# Patient Record
Sex: Male | Born: 1980 | Race: White | Hispanic: No | Marital: Single | Smoking: Current every day smoker
Health system: Southern US, Community
[De-identification: ages and names within clinical notes are randomized; demographics above are authoritative.]

---
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
## 2008-06-26 ENCOUNTER — Emergency Department (HOSPITAL_COMMUNITY): Admission: EM | Admit: 2008-06-26 | Discharge: 2008-06-26 | Payer: Self-pay | Admitting: Emergency Medicine

## 2013-01-30 ENCOUNTER — Emergency Department (HOSPITAL_COMMUNITY): Payer: Self-pay

## 2013-01-30 ENCOUNTER — Emergency Department (HOSPITAL_COMMUNITY)
Admission: EM | Admit: 2013-01-30 | Discharge: 2013-01-30 | Disposition: A | Discharge status: Home / Self Care | Payer: Self-pay | Attending: Emergency Medicine | Admitting: Emergency Medicine

## 2013-01-30 DIAGNOSIS — Y990 Civilian activity done for income or pay: Secondary | ICD-10-CM | POA: Insufficient documentation

## 2013-01-30 DIAGNOSIS — L03019 Cellulitis of unspecified finger: Secondary | ICD-10-CM | POA: Insufficient documentation

## 2013-01-30 DIAGNOSIS — Y9229 Other specified public building as the place of occurrence of the external cause: Secondary | ICD-10-CM | POA: Insufficient documentation

## 2013-01-30 DIAGNOSIS — W268XXA Contact with other sharp object(s), not elsewhere classified, initial encounter: Secondary | ICD-10-CM | POA: Insufficient documentation

## 2013-01-30 DIAGNOSIS — L02511 Cutaneous abscess of right hand: Secondary | ICD-10-CM

## 2013-01-30 DIAGNOSIS — L02519 Cutaneous abscess of unspecified hand: Secondary | ICD-10-CM | POA: Insufficient documentation

## 2013-01-30 DIAGNOSIS — Y9389 Activity, other specified: Secondary | ICD-10-CM | POA: Insufficient documentation

## 2013-01-30 DIAGNOSIS — Z23 Encounter for immunization: Secondary | ICD-10-CM | POA: Insufficient documentation

## 2013-01-30 MED ORDER — SULFAMETHOXAZOLE-TRIMETHOPRIM 800-160 MG PO TABS: 1.0000 | ORAL_TABLET | Freq: Two times a day (BID) | ORAL | Status: DC

## 2013-01-30 MED ORDER — TETANUS-DIPHTH-ACELL PERTUSSIS 5-2.5-18.5 LF-MCG/0.5 IM SUSP
0.5000 mL | Freq: Once | INTRAMUSCULAR | Status: AC
  Administered 2013-01-30: 0.5 mL via INTRAMUSCULAR
  Filled 2013-01-30: qty 0.5

## 2013-01-30 NOTE — ED Provider Notes (Addendum)
History     CSN: [id number]  Arrival date & time 01/30/13  4098   First MD Initiated Contact with Patient 01/30/13 [phone]      No chief complaint on file.   (Consider location/radiation/quality/duration/timing/severity/associated sxs/prior treatment) Patient is a 32 y.o. male presenting with hand pain. The history is provided by the patient.  Hand Pain This is a new problem. The current episode started 2 days ago. The problem occurs constantly. The problem has been gradually worsening. Associated symptoms comments: Pt has noticed worsening swelling and pain to the tip of the right middle finger.  States may have glass in the area b/c he works in [employer] and constantly is coming in contact with broken glass. Exacerbated by: palpation. Nothing relieves the symptoms. He has tried nothing for the symptoms. The treatment provided no relief.    No past medical history on file.  No past surgical history on file.  No family history on file.  History  Substance Use Topics  . Smoking status: Not on file  . Smokeless tobacco: Not on file  . Alcohol Use: Not on file      Review of Systems  All other systems reviewed and are negative.    Allergies  Review of patient's allergies indicates not on file.  Home Medications  No current outpatient prescriptions on file.  BP 156/90  Pulse 101  Temp(Src) 98.1 F (36.7 C) (Oral)  Resp 15  SpO2 98%  Physical Exam  Nursing note and vitals reviewed. Constitutional: He is oriented to person, place, and time. He appears well-developed and well-nourished. No distress.  HENT:  Head: Normocephalic and atraumatic.  Mouth/Throat: Oropharynx is clear and moist.  Eyes: EOM are normal. Pupils are equal, round, and reactive to light.  Neck: Normal range of motion. Neck supple.  Pulmonary/Chest: Effort normal.  Musculoskeletal: He exhibits tenderness.       Hands: Neurological: He is alert and oriented to person, place, and time.  Skin:  Skin is warm and dry. No rash noted.  Psychiatric: He has a normal mood and affect. His behavior is normal.    ED Course  Procedures (including critical care time)  Labs Reviewed - No data to display Dg Finger Middle Right  01/30/2013  *RADIOLOGY REPORT*  Clinical Data: Swollen middle finger.  Possible glass foreign body.  RIGHT MIDDLE FINGER 2+V  Comparison: None.  Findings: The mineralization and alignment are normal.  There is no evidence of acute fracture or dislocation.  The joint spaces are maintained.  There is no obvious focal soft tissue swelling, foreign body or soft tissue emphysema.  IMPRESSION: No acute osseous findings or foreign body identified.   Original Report Authenticated By: Carey Bullocks, M.D.     INCISION AND DRAINAGE Performed by: Gwyneth Sprout Consent: Verbal consent obtained. Risks and benefits: risks, benefits and alternatives were discussed Type: abscess  Body area: Right middle finger  Anesthesia: Digital block  Incision was made with a scalpel.  Local anesthetic: lidocaine 2 % without epinephrine  Anesthetic total: 2 ml  Complexity: Simple   Drainage: purulent  Drainage amount: 2mL  Packing material: none  Patient tolerance: Patient tolerated the procedure well with no immediate complications.    1. Abscess of finger, right       MDM   Patient is here due to worsening right middle finger swelling and pain. He is unclear if anything is in his finger is definitely an abscess on the tip of the right middle finger but could  also be an infected body. No signs of paronychia, tenosynovitis or other complicating factors. Will drain the abscess and update tetanus shot. Plain film pending to rule out foreign body as the patient thinks he may have glass in the area  9:53 AM Plain film negative for foreign bodies. Abscess I&D with purulent drainage. We'll discharge patient with antibiotics due to surrounding cellulitis      Gwyneth Sprout, MD 01/30/13 1610  Gwyneth Sprout, MD 01/30/13 [phone]

## 2013-01-30 NOTE — ED Notes (Signed)
Pt here for c/o rt middle finger injury/pain don't remember how it happen

## 2018-02-25 ENCOUNTER — Other Ambulatory Visit: Payer: Self-pay

## 2018-02-25 ENCOUNTER — Emergency Department (HOSPITAL_COMMUNITY): Payer: Self-pay

## 2018-02-25 ENCOUNTER — Encounter (HOSPITAL_COMMUNITY): Payer: Self-pay | Admitting: Emergency Medicine

## 2018-02-25 ENCOUNTER — Emergency Department (HOSPITAL_COMMUNITY)
Admission: EM | Admit: 2018-02-25 | Discharge: 2018-02-25 | Disposition: A | Discharge status: Home / Self Care | Payer: Self-pay | Attending: Emergency Medicine | Admitting: Emergency Medicine

## 2018-02-25 DIAGNOSIS — K610 Anal abscess: Secondary | ICD-10-CM | POA: Insufficient documentation

## 2018-02-25 DIAGNOSIS — F17210 Nicotine dependence, cigarettes, uncomplicated: Secondary | ICD-10-CM | POA: Insufficient documentation

## 2018-02-25 DIAGNOSIS — Z79899 Other long term (current) drug therapy: Secondary | ICD-10-CM | POA: Insufficient documentation

## 2018-02-25 LAB — CBC WITH DIFFERENTIAL/PLATELET
BASOS ABS: 0 10*3/uL (ref 0.0–0.1)
Basophils Relative: 0 %
Eosinophils Absolute: 0.5 10*3/uL (ref 0.0–0.7)
Eosinophils Relative: 4 %
HEMATOCRIT: 46.4 % (ref 39.0–52.0)
Hemoglobin: 16 g/dL (ref 13.0–17.0)
LYMPHS ABS: 2 10*3/uL (ref 0.7–4.0)
LYMPHS PCT: 15 %
MCH: 34.1 pg — AB (ref 26.0–34.0)
MCHC: 34.5 g/dL (ref 30.0–36.0)
MCV: 98.9 fL (ref 78.0–100.0)
MONO ABS: 1.6 10*3/uL — AB (ref 0.1–1.0)
Monocytes Relative: 12 %
NEUTROS ABS: 9.1 10*3/uL — AB (ref 1.7–7.7)
Neutrophils Relative %: 69 %
Platelets: 262 10*3/uL (ref 150–400)
RBC: 4.69 MIL/uL (ref 4.22–5.81)
RDW: 13 % (ref 11.5–15.5)
WBC: 13.1 10*3/uL — ABNORMAL HIGH (ref 4.0–10.5)

## 2018-02-25 LAB — I-STAT CHEM 8, ED
BUN: 11 mg/dL (ref 6–20)
CALCIUM ION: 1.08 mmol/L — AB (ref 1.15–1.40)
CHLORIDE: 99 mmol/L — AB (ref 101–111)
CREATININE: 0.8 mg/dL (ref 0.61–1.24)
GLUCOSE: 86 mg/dL (ref 65–99)
HCT: 50 % (ref 39.0–52.0)
Hemoglobin: 17 g/dL (ref 13.0–17.0)
POTASSIUM: 3.3 mmol/L — AB (ref 3.5–5.1)
Sodium: 136 mmol/L (ref 135–145)
TCO2: 23 mmol/L (ref 22–32)

## 2018-02-25 MED ORDER — IOPAMIDOL (ISOVUE-300) INJECTION 61%
INTRAVENOUS | Status: AC
  Filled 2018-02-25: qty 100

## 2018-02-25 MED ORDER — ONDANSETRON HCL 4 MG/2ML IJ SOLN
4.0000 mg | Freq: Once | INTRAMUSCULAR | Status: AC
  Administered 2018-02-25: 4 mg via INTRAVENOUS
  Filled 2018-02-25: qty 2

## 2018-02-25 MED ORDER — IOPAMIDOL (ISOVUE-300) INJECTION 61%
100.0000 mL | Freq: Once | INTRAVENOUS | Status: AC | PRN
  Administered 2018-02-25: 100 mL via INTRAVENOUS

## 2018-02-25 MED ORDER — LIDOCAINE-EPINEPHRINE (PF) 2 %-1:200000 IJ SOLN
10.0000 mL | Freq: Once | INTRAMUSCULAR | Status: DC
  Filled 2018-02-25: qty 20

## 2018-02-25 MED ORDER — MORPHINE SULFATE (PF) 4 MG/ML IV SOLN
4.0000 mg | Freq: Once | INTRAVENOUS | Status: AC
  Administered 2018-02-25: 4 mg via INTRAVENOUS
  Filled 2018-02-25: qty 1

## 2018-02-25 MED ORDER — HYDROCODONE-ACETAMINOPHEN 5-325 MG PO TABS: 2.0000 | ORAL_TABLET | Freq: Four times a day (QID) | ORAL | 0 refills | Status: DC | PRN

## 2018-02-25 MED ORDER — CLINDAMYCIN HCL 150 MG PO CAPS: 150.0000 mg | ORAL_CAPSULE | Freq: Four times a day (QID) | ORAL | 0 refills | Status: DC

## 2018-02-25 NOTE — ED Provider Notes (Signed)
[facility]   CSN: [id number] Arrival date & time: 02/25/18  4098     History   Chief Complaint Chief Complaint  Patient presents with  . Abscess    HPI Christian Andrews is a 37 y.o. male.  The history is provided by the patient.  Abscess  Location:  Pelvis Pelvic abscess location:  Anus Abscess quality: induration and painful   Red streaking: no   Duration:  3 days Progression:  Worsening Pain details:    Quality:  Sharp, shooting and throbbing   Severity:  Severe   Duration:  3 days   Timing:  Constant   Progression:  Worsening Chronicity:  New Context: not diabetes, not immunosuppression, not injected drug use and not skin injury   Relieved by:  Nothing Exacerbated by: sitting and palpation. Ineffective treatments:  None tried Associated symptoms: fever   Associated symptoms: no nausea   Risk factors: no prior abscess     History reviewed. No pertinent past medical history.  There are no active problems to display for this patient.   History reviewed. No pertinent surgical history.      Home Medications    Prior to Admission medications   Medication Sig Start Date End Date Taking? Authorizing Provider  bismuth subsalicylate (PEPTO BISMOL) 262 MG chewable tablet Chew 524 mg by mouth as needed for diarrhea or loose stools.   Yes [provider]  ibuprofen (ADVIL,MOTRIN) 200 MG tablet Take 600 mg by mouth every 6 (six) hours as needed for pain.   Yes [provider]  sulfamethoxazole-trimethoprim (SEPTRA DS) 800-160 MG per tablet Take 1 tablet by mouth every 12 (twelve) hours. Patient not taking: Reported on 02/25/2018 01/30/13   Gwyneth Sprout, MD    Family History History reviewed. No pertinent family history.  Social History Social History   Tobacco Use  . Smoking status: Current Some Day Smoker    Packs/day: 1.00    Types: Cigarettes  . Smokeless tobacco: Never Used    Substance Use Topics  . Alcohol use: Yes  . Drug use: Never     Allergies   Patient has no known allergies.   Review of Systems Review of Systems  Constitutional: Positive for fever.  Gastrointestinal: Negative for nausea.  All other systems reviewed and are negative.    Physical Exam Updated Vital Signs BP (!) 153/112 (BP Location: Right Arm)   Pulse (!) 115   Temp 98.5 F (36.9 C) (Oral)   Resp 16   Ht  (1.854 m)   Wt 93 kg (205 lb)   SpO2 99%   BMI 27.05 kg/m   Physical Exam  Constitutional: He is oriented to person, place, and time. He appears well-developed and well-nourished. No distress.  HENT:  Head: Normocephalic and atraumatic.  Mouth/Throat: Oropharynx is clear and moist.  Eyes: Pupils are equal, round, and reactive to light. Conjunctivae and EOM are normal.  Neck: Normal range of motion. Neck supple.  Cardiovascular: Regular rhythm and intact distal pulses. Tachycardia present.  No murmur heard. Pulmonary/Chest: Effort normal and breath sounds normal. No respiratory distress. He has no wheezes. He has no rales.  Abdominal: Soft. He exhibits no distension. There is no tenderness. There is no rebound and no guarding.  Genitourinary:     Musculoskeletal: Normal range of motion. He exhibits no edema or tenderness.  Neurological: He is alert and oriented to person, place, and time.  Skin: Skin is warm and dry. No rash  noted. No erythema.  Psychiatric: He has a normal mood and affect. His behavior is normal.  Nursing note and vitals reviewed.    ED Treatments / Results  Labs (all labs ordered are listed, but only abnormal results are displayed) Labs Reviewed  CBC WITH DIFFERENTIAL/PLATELET - Abnormal; Notable for the following components:      Result Value   WBC 13.1 (*)    MCH 34.1 (*)    Neutro Abs 9.1 (*)    Monocytes Absolute 1.6 (*)    All other components within normal limits  I-STAT CHEM 8, ED - Abnormal; Notable for the following  components:   Potassium 3.3 (*)    Chloride 99 (*)    Calcium, Ion 1.08 (*)    All other components within normal limits    EKG None  Radiology Ct Pelvis W Contrast  Result Date: 02/25/2018 CLINICAL DATA:  Abscess of anal and rectal regions, getting larger. EXAM: CT PELVIS WITH CONTRAST TECHNIQUE: Multidetector CT imaging of the pelvis was performed using the standard protocol following the bolus administration of intravenous contrast. CONTRAST:  ISOVUE-300 IOPAMIDOL (ISOVUE-300) INJECTION 61% COMPARISON:  None. FINDINGS: Urinary Tract: Visualized ureters are decompressed, unremarkable. Urinary bladder unremarkable. Bowel: Visualized large and small bowel unremarkable. No evidence of bowel obstruction. Appendix is normal. Vascular/Lymphatic: No evidence of aneurysm. Small bilateral inguinal lymph nodes, none pathologically enlarged. Reproductive:  No visible focal abnormality. Other: No free fluid or free air. There is a fluid collection noted in the perianal region, measuring 2.6 cm on image 123 of series 3. This extends over a craniocaudal length of 2.8 cm on coronal image 75. Musculoskeletal: No acute bony abnormality. IMPRESSION: 2.8 cm fluid collection adjacent to the anterior left side of the anus compatible with perianal abscess. Electronically Signed   By: Charlett Nose M.D.   On: 02/25/2018 12:27    Procedures Procedures (including critical care time) INCISION AND DRAINAGE Performed by: Gwyneth Sprout Consent: Verbal consent obtained. Risks and benefits: risks, benefits and alternatives were discussed Type: abscess  Body area: perirectal area  Anesthesia: local infiltration  Incision was made with a scalpel.  Local anesthetic: lidocaine 2% with epinephrine  Anesthetic total: 5 ml  Complexity: complex Blunt dissection to break up loculations  Drainage: purulent  Drainage amount: 4mL  Packing material: 1/4 in iodoform gauze  Patient tolerance: Patient  tolerated the procedure well with no immediate complications.     Medications Ordered in ED Medications  ondansetron (ZOFRAN) injection 4 mg (has no administration in time range)  morphine 4 MG/ML injection 4 mg (has no administration in time range)     Initial Impression / Assessment and Plan / ED Course  I have reviewed the triage vital signs and the nursing notes.  Pertinent labs & imaging results that were available during my care of the patient were reviewed by me and considered in my medical decision making (see chart for details).    Patient is a healthy 37 year old male presenting today with 4 days of rectal pain that is worsening.  Patient is never had anything like this before but is very painful to have a bowel movement.  He has had generalized pain and some chills but not known fever.  On exam patient has induration and pain in the perineum at 12:00 there is induration within the rectum as well.  No external signs of abscess with erythema or pointing. CT to further evaluate for perirectal abscess.  Patient given pain control.  3:03 PM  CT showing a perianal abscess.  I&D as above.  Patient started on clindamycin and instructed did continue to do warm soaks.   Final Clinical Impressions(s) / ED Diagnoses   Final diagnoses:  Perianal abscess    ED Discharge Orders        Ordered    clindamycin (CLEOCIN) 150 MG capsule  Every 6 hours     02/25/18 1434    HYDROcodone-acetaminophen (NORCO/VICODIN) 5-325 MG tablet  Every 6 hours PRN     02/25/18 1434       Gwyneth Sprout, MD 02/25/18 1503

## 2018-02-25 NOTE — Discharge Instructions (Signed)
Soak in a warm bath 2-3 times a day.  Pull out packing in 2-3 days if still in

## 2018-02-25 NOTE — ED Triage Notes (Signed)
Pt reports having abscess to rectum that started several days ago. Pt states site has gotten larger over the last few days.

## 2018-02-28 ENCOUNTER — Encounter (HOSPITAL_COMMUNITY): Payer: Self-pay | Admitting: Emergency Medicine

## 2018-02-28 ENCOUNTER — Emergency Department (HOSPITAL_COMMUNITY): Payer: Self-pay

## 2018-02-28 ENCOUNTER — Other Ambulatory Visit: Payer: Self-pay

## 2018-02-28 ENCOUNTER — Emergency Department (HOSPITAL_COMMUNITY)
Admission: EM | Admit: 2018-02-28 | Discharge: 2018-02-28 | Disposition: A | Discharge status: Home / Self Care | Payer: Self-pay | Attending: Emergency Medicine | Admitting: Emergency Medicine

## 2018-02-28 DIAGNOSIS — F17210 Nicotine dependence, cigarettes, uncomplicated: Secondary | ICD-10-CM | POA: Insufficient documentation

## 2018-02-28 DIAGNOSIS — K610 Anal abscess: Secondary | ICD-10-CM | POA: Insufficient documentation

## 2018-02-28 DIAGNOSIS — Z79899 Other long term (current) drug therapy: Secondary | ICD-10-CM | POA: Insufficient documentation

## 2018-02-28 LAB — BASIC METABOLIC PANEL
ANION GAP: 13 (ref 5–15)
BUN: 5 mg/dL — AB (ref 6–20)
CALCIUM: 9.3 mg/dL (ref 8.9–10.3)
CO2: 25 mmol/L (ref 22–32)
Chloride: 104 mmol/L (ref 101–111)
Creatinine, Ser: 0.63 mg/dL (ref 0.61–1.24)
GFR calc Af Amer: >60 mL/min (ref >60)
Glucose, Bld: 98 mg/dL (ref 65–99)
POTASSIUM: 3.5 mmol/L (ref 3.5–5.1)
Sodium: 142 mmol/L (ref 135–145)

## 2018-02-28 LAB — CBC WITH DIFFERENTIAL/PLATELET
Basophils Absolute: 0 10*3/uL (ref 0.0–0.1)
Basophils Relative: 0 %
EOS PCT: 1 %
Eosinophils Absolute: 0.2 10*3/uL (ref 0.0–0.7)
HEMATOCRIT: 42 % (ref 39.0–52.0)
Hemoglobin: 14.6 g/dL (ref 13.0–17.0)
LYMPHS ABS: 1.4 10*3/uL (ref 0.7–4.0)
LYMPHS PCT: 12 %
MCH: 34 pg (ref 26.0–34.0)
MCHC: 34.8 g/dL (ref 30.0–36.0)
MCV: 97.7 fL (ref 78.0–100.0)
MONO ABS: 1.2 10*3/uL — AB (ref 0.1–1.0)
Monocytes Relative: 10 %
NEUTROS ABS: 8.7 10*3/uL — AB (ref 1.7–7.7)
Neutrophils Relative %: 77 %
PLATELETS: 312 10*3/uL (ref 150–400)
RBC: 4.3 MIL/uL (ref 4.22–5.81)
RDW: 12.9 % (ref 11.5–15.5)
WBC: 11.5 10*3/uL — ABNORMAL HIGH (ref 4.0–10.5)

## 2018-02-28 MED ORDER — IOPAMIDOL (ISOVUE-300) INJECTION 61%
INTRAVENOUS | Status: AC
  Filled 2018-02-28: qty 100

## 2018-02-28 MED ORDER — IOPAMIDOL (ISOVUE-300) INJECTION 61%
100.0000 mL | Freq: Once | INTRAVENOUS | Status: AC | PRN
  Administered 2018-02-28: 100 mL via INTRAVENOUS

## 2018-02-28 MED ORDER — HYDROCODONE-ACETAMINOPHEN 5-325 MG PO TABS: 1.0000 | ORAL_TABLET | Freq: Four times a day (QID) | ORAL | 0 refills | Status: DC | PRN

## 2018-02-28 MED ORDER — HYDROMORPHONE HCL 1 MG/ML IJ SOLN
1.0000 mg | Freq: Once | INTRAMUSCULAR | Status: AC
  Administered 2018-02-28: 1 mg via INTRAVENOUS
  Filled 2018-02-28: qty 1

## 2018-02-28 MED ORDER — LIDOCAINE-EPINEPHRINE (PF) 2 %-1:200000 IJ SOLN
10.0000 mL | Freq: Once | INTRAMUSCULAR | Status: AC
  Administered 2018-02-28: 10 mL
  Filled 2018-02-28: qty 20

## 2018-02-28 NOTE — ED Provider Notes (Signed)
[facility]-EMER[facility] DEPT Provider Note   CSN: [id number] Arrival date & time: 02/28/18  0236     History   Chief Complaint Chief Complaint  Patient presents with  . Abscess    HPI Christian Andrews is a 37 y.o. male.  Patient presents to the emergency department with a chief complaint of abscess.  He states that he was seen on Monday for the same and had a perianal abscess drained in the emergency department.  He reports that since then he has had worsening of his symptoms.  He reports that the abscesses grown in size.  He denies measured fever, but reports that he has had subjective fevers and chills.  He rates his pain is moderate to severe.  He has been taking clindamycin as prescribed.  The history is provided by the patient. No language interpreter was used.    History reviewed. No pertinent past medical history.  There are no active problems to display for this patient.   History reviewed. No pertinent surgical history.      Home Medications    Prior to Admission medications   Medication Sig Start Date End Date Taking? Authorizing Provider  bismuth subsalicylate (PEPTO BISMOL) 262 MG chewable tablet Chew 524 mg by mouth as needed for diarrhea or loose stools.    [provider]  clindamycin (CLEOCIN) 150 MG capsule Take 1 capsule (150 mg total) by mouth every 6 (six) hours. 02/25/18   Gwyneth Sprout, MD  HYDROcodone-acetaminophen (NORCO/VICODIN) 5-325 MG tablet Take 2 tablets by mouth every 6 (six) hours as needed for severe pain. 02/25/18   Gwyneth Sprout, MD  ibuprofen (ADVIL,MOTRIN) 200 MG tablet Take 600 mg by mouth every 6 (six) hours as needed for pain.    [provider]  sulfamethoxazole-trimethoprim (SEPTRA DS) 800-160 MG per tablet Take 1 tablet by mouth every 12 (twelve) hours. Patient not taking: Reported on 02/25/2018 01/30/13   Gwyneth Sprout, MD    Family History History reviewed. No pertinent family  history.  Social History Social History   Tobacco Use  . Smoking status: Current Some Day Smoker    Packs/day: 1.00    Types: Cigarettes  . Smokeless tobacco: Never Used  Substance Use Topics  . Alcohol use: Yes  . Drug use: Never     Allergies   Patient has no known allergies.   Review of Systems Review of Systems  All other systems reviewed and are negative.    Physical Exam Updated Vital Signs BP (!) 133/99 (BP Location: Left Arm)   Pulse (!) 126   Temp 98.1 F (36.7 C) (Oral)   Resp (!) 21   Ht  (1.854 m)   Wt 93 kg (205 lb)   SpO2 98%   BMI 27.05 kg/m   Physical Exam  Constitutional: He is oriented to person, place, and time. He appears well-developed and well-nourished.  HENT:  Head: Normocephalic and atraumatic.  Eyes: Pupils are equal, round, and reactive to light. Conjunctivae and EOM are normal. Right eye exhibits no discharge. Left eye exhibits no discharge. No scleral icterus.  Neck: Normal range of motion. Neck supple. No JVD present.  Cardiovascular: Normal rate, regular rhythm and normal heart sounds. Exam reveals no gallop and no friction rub.  No murmur heard. Pulmonary/Chest: Effort normal and breath sounds normal. No respiratory distress. He has no wheezes. He has no rales. He exhibits no tenderness.  Abdominal: Soft. He exhibits no distension and no mass. There is no tenderness.  There is no rebound and no guarding.  Genitourinary:  Genitourinary Comments: Significant induration and tenderness around the rectum and extending toward the perineum  Musculoskeletal: Normal range of motion. He exhibits no edema or tenderness.  Neurological: He is alert and oriented to person, place, and time.  Skin: Skin is warm and dry.  Psychiatric: He has a normal mood and affect. His behavior is normal. Judgment and thought content normal.  Nursing note and vitals reviewed.    ED Treatments / Results  Labs (all labs ordered are listed, but only  abnormal results are displayed) Labs Reviewed  CBC WITH DIFFERENTIAL/PLATELET  BASIC METABOLIC PANEL    EKG None  Radiology Ct Pelvis W Contrast  Result Date: 02/28/2018 CLINICAL DATA:  Assess recurrent rectal abscess. EXAM: CT PELVIS WITH CONTRAST TECHNIQUE: Multidetector CT imaging of the pelvis was performed using the standard protocol following the bolus administration of intravenous contrast. CONTRAST:  100 mL ISOVUE-300 IOPAMIDOL (ISOVUE-300) INJECTION 61% COMPARISON:  CT of the pelvis performed 02/25/2018 FINDINGS: Urinary Tract: The bladder is mildly distended and grossly unremarkable. Bowel: Visualized small and large bowel loops are grossly unremarkable. The appendix is normal in caliber, without evidence of appendicitis. Vascular/Lymphatic: No retroperitoneal or pelvic sidewall lymphadenopathy is seen. Visualized vasculature is grossly unremarkable. Reproductive:  The prostate is borderline normal in size. Other: A prominent 4.5 x 3.9 x 2.8 cm abscess is noted anterior to the anorectal canal, tracking inferiorly to the left side of the distal anorectal canal, with surrounding soft tissue inflammation. Musculoskeletal: No acute osseous abnormalities are identified. The visualized musculature is unremarkable in appearance. IMPRESSION: Prominent 4.5 x 2.8 x 2.8 cm abscess noted anterior to the anorectal canal, tracking inferiorly to the left side of the distal anorectal canal, with surrounding soft tissue inflammation. Electronically Signed   By: Roanna Raider M.D.   On: 02/28/2018 04:52    Procedures Procedures (including critical care time) INCISION AND DRAINAGE Performed by: Roxy Horseman Consent: Verbal consent obtained. Risks and benefits: risks, benefits and alternatives were discussed Type: abscess  Body area: perianal  Anesthesia: local infiltration  Incision was made with a scalpel.  Local anesthetic: lidocaine 1% with epinephrine  Anesthetic total: 8  ml  Complexity: complex Blunt dissection to break up loculations  Drainage: purulent  Drainage amount: moderate  Packing material: 1/4 in iodoform gauze  Patient tolerance: Patient tolerated the procedure well with no immediate complications.    Medications Ordered in ED Medications  HYDROmorphone (DILAUDID) injection 1 mg (1 mg Intravenous Given 02/28/18 0338)     Initial Impression / Assessment and Plan / ED Course  I have reviewed the triage vital signs and the nursing notes.  Pertinent labs & imaging results that were available during my care of the patient were reviewed by me and considered in my medical decision making (see chart for details).     Patient recently seen for perianal abscess.  Had a incision and drainage done in the ED.  He has had significantly worsening of his symptoms.  He now has some induration that is extending toward his perineum which is very tender to palpation.  Given the acutely worsening symptoms and increase in size, will repeat CT.  CT shows prominent 4.5 x 2.8 x 2.8 cm abscess anterior to the anorectal canal.  I discussed the read with the radiologist, who states the abscess is contiguous, meaning that I should be able to drain the abscess completely if I approach it from the perimeter, thus avoiding any  crucial rectal structures.  Incident and drainage was successful.  There is moderate amount of pus that was expressed.  The area is now soft and nontender.  He has clindamycin at home.  I will refill his pain medicine as he is run out.  Final Clinical Impressions(s) / ED Diagnoses   Final diagnoses:  Perianal abscess    ED Discharge Orders        Ordered    HYDROcodone-acetaminophen (NORCO/VICODIN) 5-325 MG tablet  Every 6 hours PRN     02/28/18 0528       Roxy Horseman, PA-C 02/28/18 0529    Ward, Layla Maw, DO 02/28/18 [phone]

## 2018-02-28 NOTE — ED Triage Notes (Signed)
Patient states he had abscess lanced on Monday but patient states the abscess has doubled in size. Patient states it is painful.

## 2020-10-05 ENCOUNTER — Emergency Department (HOSPITAL_COMMUNITY): Payer: Self-pay

## 2020-10-05 ENCOUNTER — Emergency Department (HOSPITAL_COMMUNITY)
Admission: EM | Admit: 2020-10-05 | Discharge: 2020-10-05 | Disposition: A | Discharge status: Home / Self Care | Payer: Self-pay | Attending: Emergency Medicine | Admitting: Emergency Medicine

## 2020-10-05 ENCOUNTER — Encounter (HOSPITAL_COMMUNITY): Payer: Self-pay

## 2020-10-05 ENCOUNTER — Other Ambulatory Visit: Payer: Self-pay

## 2020-10-05 DIAGNOSIS — W109XXA Fall (on) (from) unspecified stairs and steps, initial encounter: Secondary | ICD-10-CM | POA: Insufficient documentation

## 2020-10-05 DIAGNOSIS — Z789 Other specified health status: Secondary | ICD-10-CM

## 2020-10-05 DIAGNOSIS — S00502A Unspecified superficial injury of oral cavity, initial encounter: Secondary | ICD-10-CM | POA: Insufficient documentation

## 2020-10-05 DIAGNOSIS — S0993XA Unspecified injury of face, initial encounter: Secondary | ICD-10-CM

## 2020-10-05 DIAGNOSIS — R55 Syncope and collapse: Secondary | ICD-10-CM

## 2020-10-05 DIAGNOSIS — F17210 Nicotine dependence, cigarettes, uncomplicated: Secondary | ICD-10-CM | POA: Insufficient documentation

## 2020-10-05 DIAGNOSIS — S0240CA Maxillary fracture, right side, initial encounter for closed fracture: Secondary | ICD-10-CM

## 2020-10-05 DIAGNOSIS — F10920 Alcohol use, unspecified with intoxication, uncomplicated: Secondary | ICD-10-CM | POA: Insufficient documentation

## 2020-10-05 DIAGNOSIS — Y9301 Activity, walking, marching and hiking: Secondary | ICD-10-CM | POA: Insufficient documentation

## 2020-10-05 LAB — COMPREHENSIVE METABOLIC PANEL
ALT: 24 U/L (ref 0–44)
AST: 31 U/L (ref 15–41)
Albumin: 4.4 g/dL (ref 3.5–5.0)
Alkaline Phosphatase: 48 U/L (ref 38–126)
Anion gap: 13 (ref 5–15)
BUN: 14 mg/dL (ref 6–20)
CO2: 23 mmol/L (ref 22–32)
Calcium: 9 mg/dL (ref 8.9–10.3)
Chloride: 99 mmol/L (ref 98–111)
Creatinine, Ser: 0.84 mg/dL (ref 0.61–1.24)
GFR, Estimated: >60 mL/min (ref >60)
Glucose, Bld: 98 mg/dL (ref 70–99)
Potassium: 3.7 mmol/L (ref 3.5–5.1)
Sodium: 135 mmol/L (ref 135–145)
Total Bilirubin: 1.3 mg/dL — ABNORMAL HIGH (ref 0.3–1.2)
Total Protein: 7.5 g/dL (ref 6.5–8.1)

## 2020-10-05 LAB — CBC WITH DIFFERENTIAL/PLATELET
Abs Immature Granulocytes: 0.04 10*3/uL (ref 0.00–0.07)
Basophils Absolute: 0.1 10*3/uL (ref 0.0–0.1)
Basophils Relative: 1 %
Eosinophils Absolute: 0.1 10*3/uL (ref 0.0–0.5)
Eosinophils Relative: 1 %
HCT: 50.5 % (ref 39.0–52.0)
Hemoglobin: 16.9 g/dL (ref 13.0–17.0)
Immature Granulocytes: 0 %
Lymphocytes Relative: 9 %
Lymphs Abs: 1.1 10*3/uL (ref 0.7–4.0)
MCH: 34 pg (ref 26.0–34.0)
MCHC: 33.5 g/dL (ref 30.0–36.0)
MCV: 101.6 fL — ABNORMAL HIGH (ref 80.0–100.0)
Monocytes Absolute: 0.6 10*3/uL (ref 0.1–1.0)
Monocytes Relative: 5 %
Neutro Abs: 9.4 10*3/uL — ABNORMAL HIGH (ref 1.7–7.7)
Neutrophils Relative %: 84 %
Platelets: 257 10*3/uL (ref 150–400)
RBC: 4.97 MIL/uL (ref 4.22–5.81)
RDW: 13.7 % (ref 11.5–15.5)
WBC: 11.2 10*3/uL — ABNORMAL HIGH (ref 4.0–10.5)
nRBC: 0 % (ref 0.0–0.2)

## 2020-10-05 LAB — ETHANOL: Alcohol, Ethyl (B): <10 mg/dL (ref <10)

## 2020-10-05 LAB — CBG MONITORING, ED: Glucose-Capillary: 110 mg/dL — ABNORMAL HIGH (ref 70–99)

## 2020-10-05 MED ORDER — SODIUM CHLORIDE 0.9 % IV BOLUS
1000.0000 mL | Freq: Once | INTRAVENOUS | Status: AC
  Administered 2020-10-05: 1000 mL via INTRAVENOUS

## 2020-10-05 NOTE — ED Provider Notes (Signed)
[facility]-EMERGENCY DEPT Provider Note   CSN: [id number] Arrival date & time: 10/05/20  1441     History Chief Complaint  Patient presents with  . Fall    Christian Andrews is a 39 y.o. male with no past medical history presents to the ED for a fall.  Per triage note patient was walking up some steps and tripped and fell.  However, patient states he does not know why he said that because that not what happened.  States he got up quickly to go to the bathroom and remembers standing to urinate and all of a sudden remembers waking up.  He thinks he "buckled" down because he woke up over the toilet.  His glasses had fallen off.  He reports pain on the top lip with bleeding, dental pain.  States when he saw the blood he got very queasy.  This happened at around 2 PM.  He called out of work.  Drove to the ED.  Has been feeling well the last few days.  States he has passed out once actually in a similar way many years ago.  He was also urinating and standing up at that time.  Never sought medical care for it.  Currently feels loopy and reports upper dental pain where he states he cracked a few teeth there.  Denies any other physical injuries from fall. Denies remembering any prodromal symptoms like chest pain, shortness of breath or palpitations.  Denies any known heart or lung problems.  No medical problems.  Drinks 6 beers nightly, last drink at 9 PM yesterday.  Thinks he probably has had about 16 ounces of water today.  Initially denies illicit drug use.  During exam however significant intranasal mucosal breakdown noted and ulceration in the septum, patient admits then he does snort cocaine, last use last night.   HPI     History reviewed. No pertinent past medical history.  There are no problems to display for this patient.   History reviewed. No pertinent surgical history.     History reviewed. No pertinent family history.  Social History   Tobacco Use  . Smoking  status: Current Some Day Smoker    Packs/day: 1.00    Types: Cigarettes  . Smokeless tobacco: Never Used  Substance Use Topics  . Alcohol use: Yes  . Drug use: Never    Home Medications Prior to Admission medications   Medication Sig Start Date End Date Taking? Authorizing Provider  clindamycin (CLEOCIN) 150 MG capsule Take 1 capsule (150 mg total) by mouth every 6 (six) hours. 02/25/18   Gwyneth Sprout, MD  HYDROcodone-acetaminophen (NORCO/VICODIN) 5-325 MG tablet Take 1-2 tablets by mouth every 6 (six) hours as needed. 02/28/18   Roxy Horseman, PA-C  ibuprofen (ADVIL,MOTRIN) 200 MG tablet Take 600 mg by mouth every 6 (six) hours as needed for pain.    [provider]    Allergies    Patient has no known allergies.  Review of Systems   Review of Systems  HENT: Positive for dental problem.   Neurological: Positive for syncope.  All other systems reviewed and are negative.   Physical Exam Updated Vital Signs BP 124/90   Pulse 85   Temp 97.6 F (36.4 C) (Oral)   Resp 15   SpO2 99%   Physical Exam Vitals and nursing note reviewed.  Constitutional:      Appearance: He is well-developed and well-nourished.     Comments: NAD.  HENT:  Head: Normocephalic.     Comments: Tenderness over central alveolar process of upper maxilla.  No visualized facial or lip lacerations. No nasal bone or other facial/scalp bone tenderness. Linear abrasion on top of scalp - patient states he hit his head while at work a few weeks ago, non tender    Right Ear: External ear normal.     Left Ear: External ear normal.     Nose: Rhinorrhea present.     Comments: Significant skin break down in both nares, intranasal mucosa skin break down, erythematous with copious clear rhinorrhea bilaterally.  Ulcerated break down in bilateral septum R>L. No visualized FB, no septal hematoma or perforation. Patient admits to snorting cocaine.    Mouth/Throat:     Mouth: Mucous membranes are dry.      Comments: Chapped lips, tongue/MM tacky. Both upper central incisors have been pushed into gingiva, tender. Minimal gingival injury at base of left central incisor, tenderness to percussion. No other gingival or intraoral/tongue injury.  Eyes:     General: No scleral icterus.    Extraocular Movements: EOM normal.     Conjunctiva/sclera: Conjunctivae normal.  Neck:     Comments: No midline or paraspinal muscular tenderness. Full ROM of neck without pain.  Cardiovascular:     Rate and Rhythm: Normal rate and regular rhythm.     Pulses: Intact distal pulses.     Heart sounds: Normal heart sounds. No murmur heard.   Pulmonary:     Effort: Pulmonary effort is normal.     Breath sounds: Normal breath sounds. No wheezing.  Musculoskeletal:        General: No deformity. Normal range of motion.     Cervical back: Normal range of motion and neck supple.     Comments: Easily sits up on chair without assistance. No midline or paraspinal TTP of TL spine  Skin:    General: Skin is warm and dry.     Capillary Refill: Capillary refill takes less than 2 seconds.  Neurological:     Mental Status: He is alert and oriented to person, place, and time.     Comments:  Awake, alert. Speech clear. Sensation to light touch intact in face, upper/lower extremities. Strength equal and symmetric bilaterally. No arm or leg drop/drift. Normal FTN. CN 2-12 intact.   Psychiatric:        Mood and Affect: Mood is anxious.        Behavior: Behavior normal.        Thought Content: Thought content normal.        Judgment: Judgment normal.     Comments: Appears anxious. Poor eye contact. No tremors. Cooperative.      ED Results / Procedures / Treatments   Labs (all labs ordered are listed, but only abnormal results are displayed) Labs Reviewed  CBC WITH DIFFERENTIAL/PLATELET - Abnormal; Notable for the following components:      Result Value   WBC 11.2 (*)    MCV 101.6 (*)    Neutro Abs 9.4 (*)    All other  components within normal limits  COMPREHENSIVE METABOLIC PANEL - Abnormal; Notable for the following components:   Total Bilirubin 1.3 (*)    All other components within normal limits  CBG MONITORING, ED - Abnormal; Notable for the following components:   Glucose-Capillary 110 (*)    All other components within normal limits  ETHANOL    EKG None  Radiology No results found.  Procedures Procedures (including critical care time)  Medications Ordered in ED Medications  sodium chloride 0.9 % bolus 1,000 mL (0 mLs Intravenous Stopped 10/05/20 2130)    ED Course  I have reviewed the triage vital signs and the nursing notes.  Pertinent labs & imaging results that were available during my care of the patient were reviewed by me and considered in my medical decision making (see chart for details).  Clinical Course as of 10/05/20 2205  Tue Oct 05, 2020  1909  134/86 96 laying  127/85 105 sitting  122/90 122 standing  [CG]  2001 CT Maxillofacial Wo Contrast IMPRESSION: 1. No acute intracranial abnormality. 2. Comminuted fracture of bilateral processus alveolaris and nondisplaced fracture of the right processus nasalis of the maxilla. 3. Apical lucency of bilateral maxillary medial incisors consistent with loosening in the setting of trauma. Definite dislocation of the right medial incisor. 4. Suggestion of acute fracture of the nasal septum. [CG]    Clinical Course User Index [CG] Jerrell Mylar   MDM Rules/Calculators/A&P                          39 year old male presents for syncope and fall.    Reports one similar episode 1 several years ago.  No known medical problems, daily medicines.  Admits to drinking 6 beers nightly and snorting cocaine at least every other day.  Denies previous history of withdrawal issues, seizures.  Denies prodrome.  Currently feels at his baseline other than central incisor dental pain.   DDx includes orthostatic syncope versus vasovagal  syncope versus dehydration versus intoxication versus arrhythmia.  Considered seizure as well.  States he had [place] type seizures when he was a child.  Given history of EtOH use, withdrawal seizure considered as well but clinically does not appear to be withdrawing.  No tachycardia, diaphoresis, nausea, vomiting.  No tremors on exam.  Considered stroke, PE unlikely.  Normal neuro exam.  No headache.  No tachycardia, chest pain, shortness of breath or hypoxia.  Labs, imaging as above.  Continues cardiac monitoring.  Continuous pulse oximeter.  Orthostatics.  Ordered 1 L IVF  ER work-up personally visualized and interpreted  Labs are essentially unremarkable.  Normal hemoglobin.  Normal electrolytes.  EtOH undetectable.  Creatinine, LFTs normal.  EKG with normal sinus rhythm, and frequent PACs.  Normal intervals, no ischemia, no delta wave.  CT head and maxillofacial revealed maxilla fracture and displacement of bilateral central incisors, nasal fracture. Will recommend maxillofacial/dentist follow up. Imaging reviewed with EDP.   Patient had positive orthostatics, increase in HR from laying to standing and slight drop in SBP.  Clinically he appears dehydrated.  Patient reevaluated and updated on ER work-up, no clinical decline.  Suspect patient had orthostatic syncope likely exacerbated by alcohol consumption, poor oral fluid intake, dehydration.  Appropriate for discharge.  [place] syncope rule deems in the low risk.  Patient was given community dental resources and cannot command clinic information.d Final Clinical Impression(s) / ED Diagnoses Final diagnoses:  Syncope and collapse  Dental injury, initial encounter  Daily consumption of alcohol  Maxillary fracture, right side, initial encounter for closed fracture [facility])    Rx / DC Orders ED Discharge Orders    None       Jerrell Mylar 10/05/20 2205    Milagros Loll, MD 10/07/20 [phone]

## 2020-10-05 NOTE — ED Triage Notes (Signed)
Pt reports tripping and falling while walking down some stairs. Pt states he lost some of his front teeth. Pt reports feeling queasy when he saw the blood coming from his mouth.

## 2020-10-05 NOTE — ED Notes (Signed)
Pt reports no pacemaker present.

## 2020-10-05 NOTE — Discharge Instructions (Signed)
You were seen in the ER for passing out episode and collapse  I think this happened because of your blood pressure dropping, dehydration  Stay hydrated. Recommend stopping alcohol and cocaine use  You have fracture of your maxilla (bone above your upper lip) with some displaced teeth.  There is no treatment needed emergently tonight but you need follow up with maxillofacial dental surgeon. Please call his office.  Refer to other dental resources below  Ibuprofen 600 mg and/or 1000 mg acetaminophen every 6-8 hours for pain. Ice. Swirl warm water with salt around area of dental injury. You may apply oragel as needed   Return for repeat passing out, chest pain, shortness of breath

## 2021-01-25 IMAGING — CT CT MAXILLOFACIAL W/O CM
3 series · 14 of 47 positions shown, 16 images · non-contrast
Comparison: None.

CLINICAL DATA: Syncope, dental injury.  Trip and fall.

EXAM:
CT HEAD WITHOUT CONTRAST
CT MAXILLOFACIAL WITHOUT CONTRAST
TECHNIQUE: Multidetector CT imaging of the head and maxillofacial structures
were performed using the standard protocol without intravenous
contrast. Multiplanar CT image reconstructions of the maxillofacial
structures were also generated.

[Series 3: max soft · axial · 0.33mm/px · z∈[-271,-119]mm · 8 of 90 slices shown, 10 images]
[im 7/90  brain]
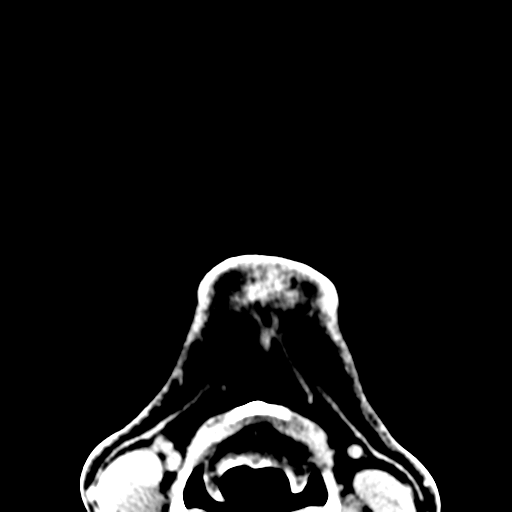
[im 7/90  bone]
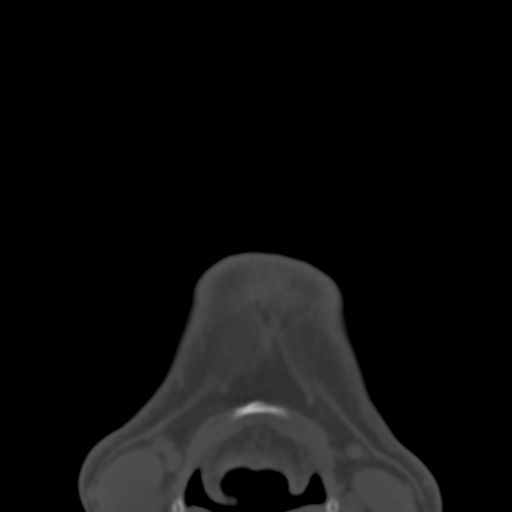
[im 19/90  bone]
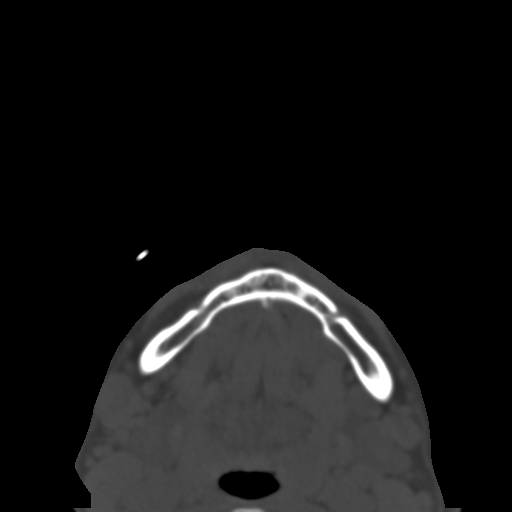
[im 28/90  bone]
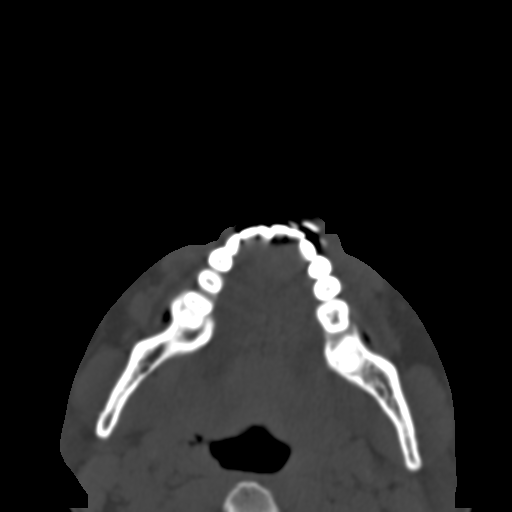
[im 40/90  bone]
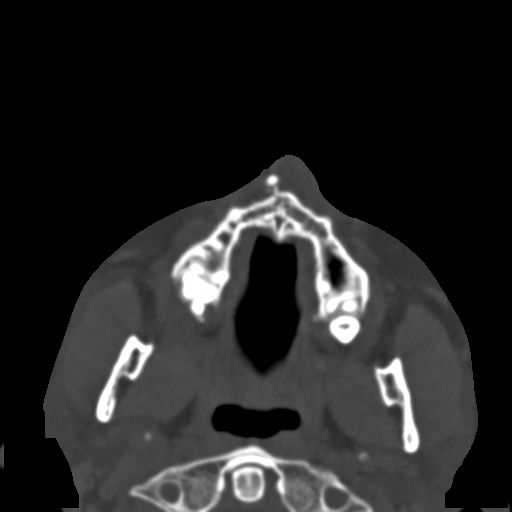
[im 50/90  brain]
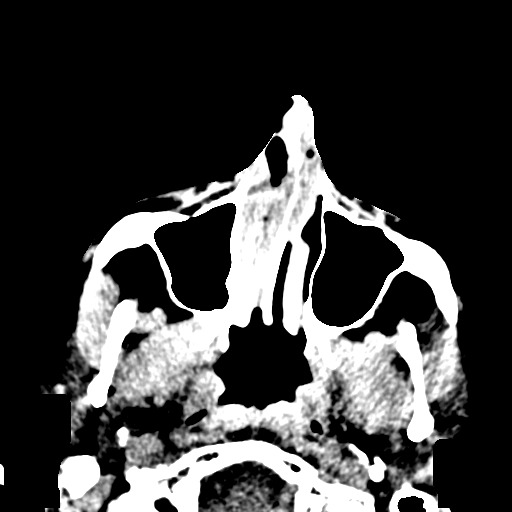
[im 50/90  bone]
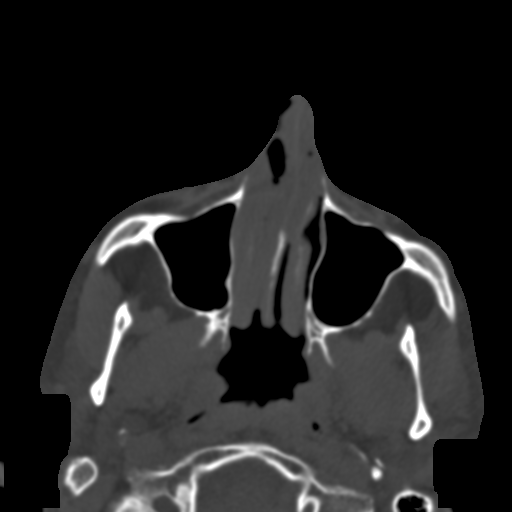
[im 62/90  bone]
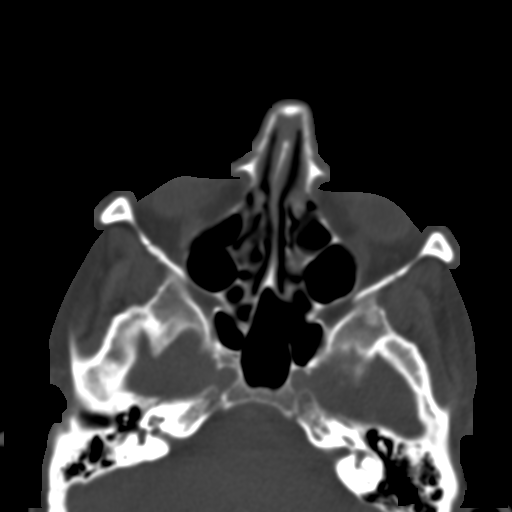
[im 71/90  bone]
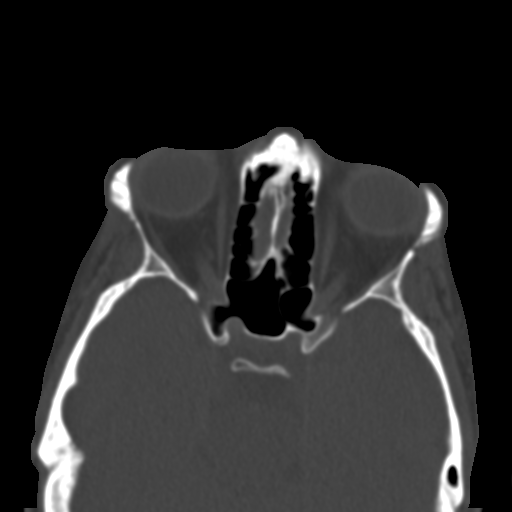
[im 83/90  bone]
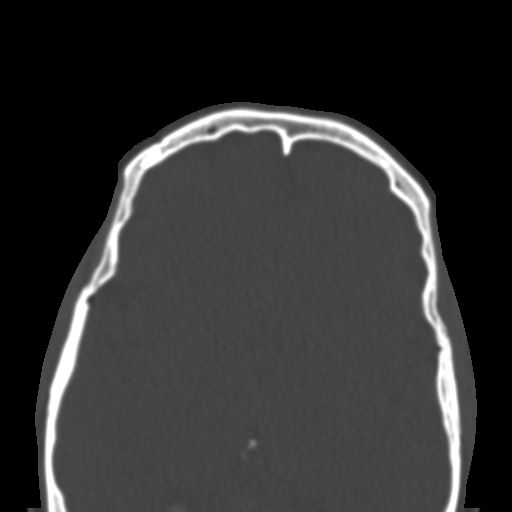

[Series 7: coronal soft · coronal · 0.34mm/px · 3 of 78 slices shown]
[im 26/78  bone]
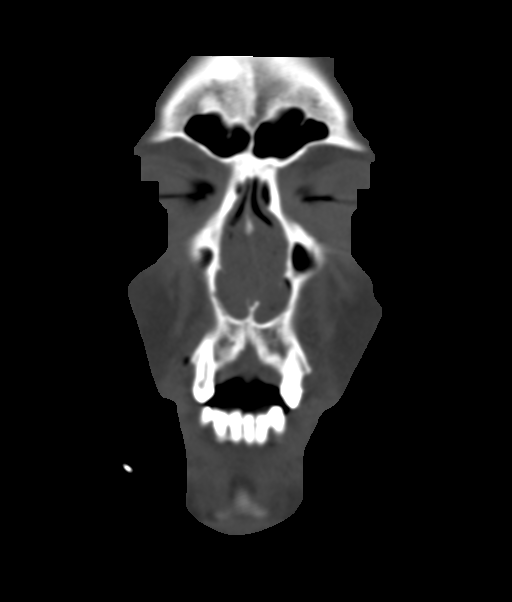
[im 35/78  bone]
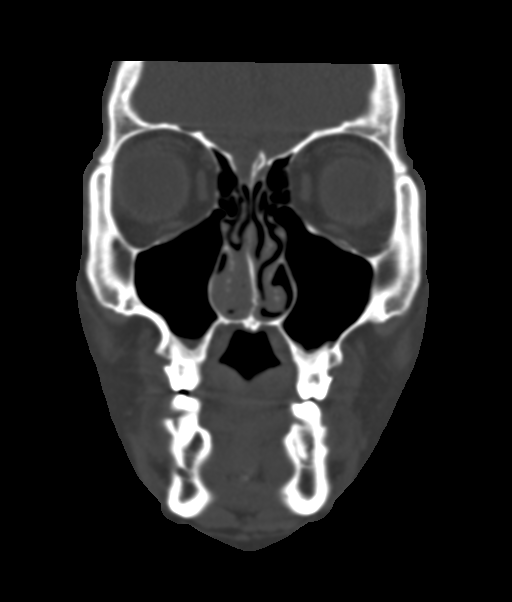
[im 43/78  bone]
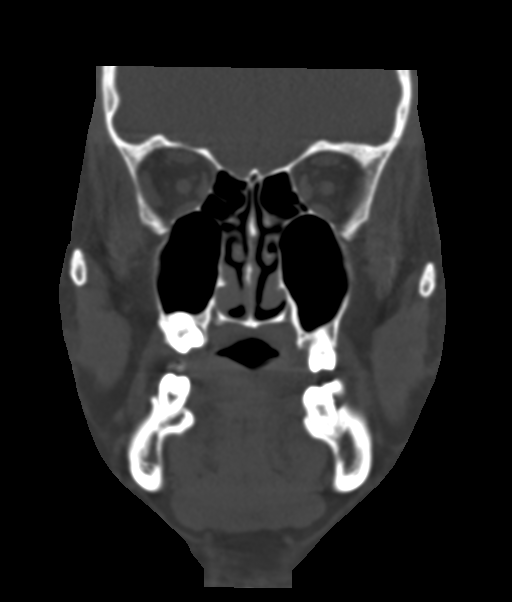

[Series 8: sagittal soft · sagittal · 0.30mm/px · 3 of 89 slices shown]
[im 30/89  bone]
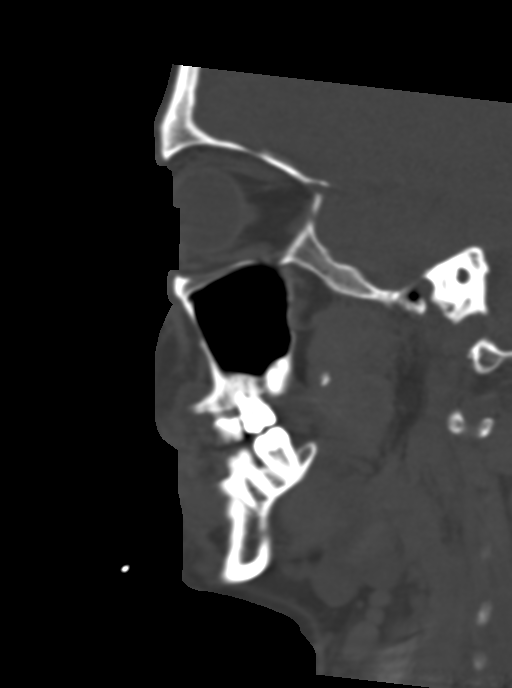
[im 45/89  bone]
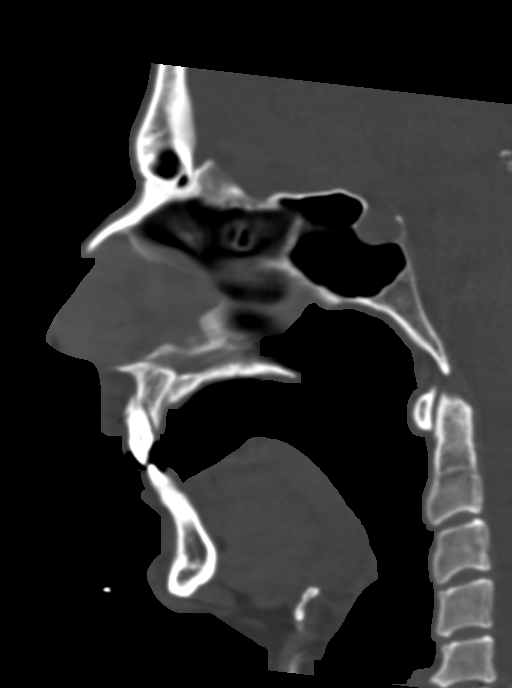
[im 59/89  bone]
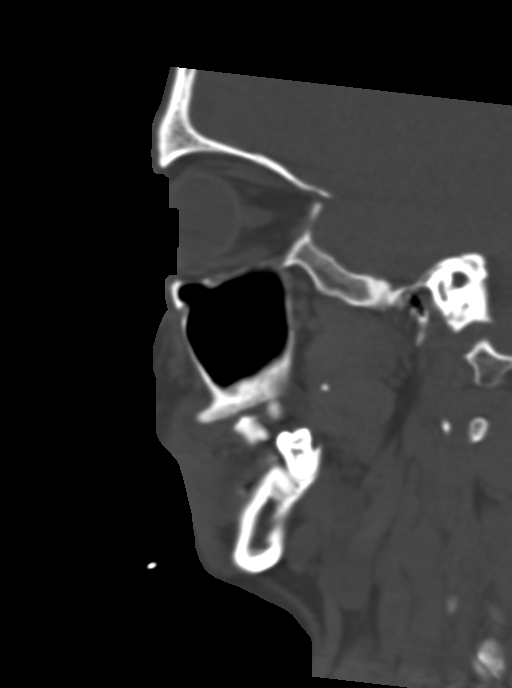

[14 of 47 positions shown; findings below may reference images not displayed]

FINDINGS: CT HEAD FINDINGS

Brain:

No evidence of large-territorial acute infarction. No parenchymal
hemorrhage. No mass lesion. No extra-axial collection.

No mass effect or midline shift. No hydrocephalus. Basilar cisterns
are patent.

Vascular: No hyperdense vessel.

Skull: No acute fracture or focal lesion.

Other: Metallic density noted external to the right temporal scalp
([DATE]).

CT MAXILLOFACIAL FINDINGS

Osseous: Periapical lucency surrounding bilateral maxillary medial
incisors. Definite loosening/dislocation of the right medial incisor
([DATE]). Associated surrounding comminuted fracture of the anterior
medial processus alveolaris of the maxilla bilaterally. Suggestion
of extension of the fracture with tiny avulsion fracture of the
right processus nasalis of the maxilla ([DATE]). Suggestion of acute
fracture of the nasal septum.

Orbits: Negative. No traumatic or inflammatory finding.

Sinuses: Clear.

Soft tissues: Associated subcutaneus soft tissue edema.
IMPRESSION: 1. No acute intracranial abnormality.
2. Comminuted fracture of bilateral processus alveolaris and
nondisplaced fracture of the right processus nasalis of the maxilla.
3. Apical lucency of bilateral maxillary medial incisors consistent
with loosening in the setting of trauma. Definite dislocation of the
right medial incisor.
4. Suggestion of acute fracture of the nasal septum.

## 2021-04-29 ENCOUNTER — Other Ambulatory Visit: Payer: Self-pay

## 2021-04-29 ENCOUNTER — Ambulatory Visit (HOSPITAL_COMMUNITY)
Admission: EM | Admit: 2021-04-29 | Discharge: 2021-04-29 | Disposition: A | Discharge status: Home / Self Care | Payer: No Payment, Other | Attending: Psychiatry | Admitting: Psychiatry

## 2021-04-29 DIAGNOSIS — Z7289 Other problems related to lifestyle: Secondary | ICD-10-CM | POA: Diagnosis not present

## 2021-04-29 DIAGNOSIS — Z634 Disappearance and death of family member: Secondary | ICD-10-CM | POA: Diagnosis not present

## 2021-04-29 DIAGNOSIS — F1490 Cocaine use, unspecified, uncomplicated: Secondary | ICD-10-CM | POA: Diagnosis not present

## 2021-04-29 DIAGNOSIS — F4320 Adjustment disorder, unspecified: Secondary | ICD-10-CM | POA: Diagnosis not present

## 2021-04-29 DIAGNOSIS — Z789 Other specified health status: Secondary | ICD-10-CM

## 2021-04-29 DIAGNOSIS — Z9151 Personal history of suicidal behavior: Secondary | ICD-10-CM | POA: Insufficient documentation

## 2021-04-29 DIAGNOSIS — F4321 Adjustment disorder with depressed mood: Secondary | ICD-10-CM

## 2021-04-29 DIAGNOSIS — Z818 Family history of other mental and behavioral disorders: Secondary | ICD-10-CM | POA: Insufficient documentation

## 2021-04-29 NOTE — ED Notes (Signed)
Pt discharged with resources and recommendations for follow up care provided on AVS. Safety maintained.

## 2021-04-29 NOTE — Discharge Instructions (Addendum)
   Please come to [facility] (this facility) during walk in hours for appointment with psychiatrist for further medication management and for therapists for therapy.    Walk in hours are 8-11 AM Monday through Thursday for medication management with a psychiatrist.  Therapy walk in hours are Monday-Wednesday 8 AM-1PM.   It is first come, first -serve; it is best to arrive by 7:00 AM.   On Friday from 1 pm to 4 pm for therapy intake only. Please arrive by 12:00 pm as it is  first come, first -serve.    When you arrive please go upstairs for your appointment. If you are unsure of where to go, inform the front desk that you are here for a walk in appointment and they will assist you with directions upstairs.  Address:  [address], in [place], [address] Ph: [phone]

## 2021-04-29 NOTE — BH Assessment (Signed)
Comprehensive Clinical Assessment (CCA) Note  04/29/2021 Christian Andrews [id number]  Patient is a 40 year old male presenting voluntarily to [facility] for evaluation via [employer]. He states he contacted the suicide hotline this morning because he needed to speak with someone about his grief but they sent police to his home. He states he is not suicidal but needs resources and help. Patient reports 3 months ago his mother passed away from COPD and he was her primary caretaker. 3 days after her death his brother hung himself. He states that was his only family and he is now in charge of managing their estates. He states his mother's life insurance policy was cancelled and she has a large amount of debt from her hospital bills. Financial stress is a major contributing factor to his stress.  He denies current SI/HI/AVH. He states he sought grief counseling through work, but it was over the phone and he did not connect with the therapist so he quit. Patient states he would like to get back into therapy. Patient also reports using alcohol and cocaine. He states he drinks roughly 5 beers on a daily basis and uses about 1 gram of cocaine per week. He has never participated in [place] treatment.   Per Dr. Bronwen Betters patient does not meet in patient care criteria and is psych cleared. Patient provided with additional resources for counseling.  Chief Complaint:  Chief Complaint  Patient presents with   Adjustment Disorder   Visit Diagnosis: F43.21 Adjustment disorder, with depressed mood    CCA Screening, Triage and Referral (STR)  Patient Reported Information How did you hear about [place]? Legal System  What Is the Reason for Your Visit/Call Today? contacted suicide hotline  How Long Has This Been Causing You Problems? 1-6 months  What Do You Feel Would Help You the Most Today? Treatment for Depression or other mood problem   Have You Recently Had Any Thoughts About Hurting Yourself? Yes  Are You Planning to Commit  Suicide/Harm Yourself At This time? No   Have you Recently Had Thoughts About Hurting Someone Christian Andrews? No  Are You Planning to Harm Someone at This Time? No  Explanation: No data recorded  Have You Used Any Alcohol or Drugs in the Past 24 Hours? Yes  How Long Ago Did You Use Drugs or Alcohol? No data recorded What Did You Use and How Much? alcohol and cocaine   Do You Currently Have a Therapist/Psychiatrist? No  Name of Therapist/Psychiatrist: No data recorded  Have You Been Recently Discharged From Any Office Practice or Programs? No  Explanation of Discharge From Practice/Program: No data recorded    CCA Screening Triage Referral Assessment Type of Contact: Face-to-Face  Telemedicine Service Delivery:   Is this Initial or Reassessment? No data recorded Date Telepsych consult ordered in CHL:  No data recorded Time Telepsych consult ordered in CHL:  No data recorded Location of Assessment: [facility]  Provider Location: [facility] Assessment Services   Collateral Involvement: NA   Does Patient Have a Automotive engineer Guardian? No data recorded Name and Contact of Legal Guardian: No data recorded If Minor and Not Living with Parent(s), Who has Custody? No data recorded Is CPS involved or ever been involved? Never  Is APS involved or ever been involved? Never   Patient Determined To Be At Risk for Harm To Self or Others Based on Review of Patient Reported Information or Presenting Complaint? No  Method: No data recorded Availability of Means: No  data recorded Intent: No data recorded Notification Required: No data recorded Additional Information for Danger to Others Potential: No data recorded Additional Comments for Danger to Others Potential: No data recorded Are There Guns or Other Weapons in Your Home? No data recorded Types of Guns/Weapons: No data recorded Are These Weapons Safely Secured?                            No data recorded Who  Could Verify You Are Able To Have These Secured: No data recorded Do You Have any Outstanding Charges, Pending Court Dates, Parole/Probation? No data recorded Contacted To Inform of Risk of Harm To Self or Others: No data recorded   Does Patient Present under Involuntary Commitment? No  IVC Papers Initial File Date: No data recorded  [place]  Patient Currently Receiving the Following Services: Not Receiving Services   Determination of Need: Urgent (48 hours)   Options For Referral: Outpatient Therapy     CCA Biopsychosocial Patient Reported Schizophrenia/Schizoaffective Diagnosis in Past: No   Strengths: has good insight, invested in treatment   Mental Health Symptoms Depression:   Change in energy/activity; Difficulty Concentrating; Irritability; Sleep (too much or little); Tearfulness   Duration of Depressive symptoms:  Duration of Depressive Symptoms: Greater than two weeks   Mania:   None   Anxiety:    None   Psychosis:   None   Duration of Psychotic symptoms:    Trauma:   None   Obsessions:   None   Compulsions:   None   Inattention:   None   Hyperactivity/Impulsivity:   None   Oppositional/Defiant Behaviors:   None   Emotional Irregularity:   None   Other Mood/Personality Symptoms:  No data recorded   Mental Status Exam Appearance and self-care  Stature:   Average   Weight:   Average weight   Clothing:   Neat/clean   Grooming:   Normal   Cosmetic use:   None   Posture/gait:   Normal   Motor activity:   Not Remarkable   Sensorium  Attention:   Normal   Concentration:   Normal   Orientation:   X5   Recall/memory:   Normal   Affect and Mood  Affect:   Appropriate   Mood:   Depressed   Relating  Eye contact:   Normal   Facial expression:   Responsive   Attitude toward examiner:   Cooperative   Thought and Language  Speech flow:  Clear and Coherent   Thought  content:   Appropriate to Mood and Circumstances   Preoccupation:   None   Hallucinations:   None   Organization:  No data recorded  Affiliated Computer ServicesExecutive Functions  Fund of Knowledge:   Good   Intelligence:   Average   Abstraction:   Normal   Judgement:   Good   Reality Testing:   Realistic   Insight:   Good   Decision Making:   Normal   Social Functioning  Social Maturity:   Responsible   Social Judgement:   Normal   Stress  Stressors:   Grief/losses; Financial   Coping Ability:   Normal   Skill Deficits:  No data recorded  Supports:   Support needed     Religion: Religion/Spirituality Are You A Religious Person?: No  Leisure/Recreation: Leisure / Recreation Do You Have Hobbies?: No  Exercise/Diet: Exercise/Diet Do You Exercise?: No Have You Gained  or Lost A Significant Amount of Weight in the Past Six Months?: No Do You Follow a Special Diet?: No Do You Have Any Trouble Sleeping?: Yes Explanation of Sleeping Difficulties: intermittent difficulty with sleep   CCA Employment/Education Employment/Work Situation: Employment / Work Situation Employment Situation: Employed Work Stressors: none Patient's Job has Been Impacted by Current Illness: No Has Patient ever Been in Equities trader?: No  Education: Education Is Patient Currently Attending School?: No Last Grade Completed: 12 Did You Product manager?: Yes What Type of College Degree Do you Have?: associates Did You Have An Individualized Education Program (IIEP): No Did You Have Any Difficulty At School?: No Patient's Education Has Been Impacted by Current Illness: No   CCA Family/Childhood History Family and Relationship History: Family history Marital status: Single Does patient have children?: No  Childhood History:  Childhood History By whom was/is the patient raised?: Mother Did patient suffer any verbal/emotional/physical/sexual abuse as a child?: No Did patient suffer from  severe childhood neglect?: No Has patient ever been sexually abused/assaulted/raped as an adolescent or adult?: No Was the patient ever a victim of a crime or a disaster?: No Witnessed domestic violence?: No Has patient been affected by domestic violence as an adult?: No  Child/Adolescent Assessment:     CCA Substance Use Alcohol/Drug Use: Alcohol / Drug Use Pain Medications: see MAR Prescriptions: see MAR Over the Counter: see MAR History of alcohol / drug use?: Yes Substance #1 Name of Substance 1: alcohol 1 - Age of First Use: teens 1 - Amount (size/oz): 5-6 beers 1 - Frequency: daily 1 - Duration: UTA 1 - Last Use / Amount: 5 beers this AM 1 - Method of Aquiring: purchased 1- Route of Use: drank Substance #2 Name of Substance 2: cocaine 2 - Age of First Use: 18 2 - Amount (size/oz): 1 gram 2 - Frequency: weekly 2 - Duration: UTA 2 - Last Use / Amount: this AM 2 - Method of Aquiring: pruchased 2 - Route of Substance Use: snorted                     ASAM's:  Six Dimensions of Multidimensional Assessment  Dimension 1:  Acute Intoxication and/or Withdrawal Potential:   Dimension 1:  Description of individual's past and current experiences of substance use and withdrawal: current daily alcohol use, weekly cocaine use, no withdrawal noted  Dimension 2:  Biomedical Conditions and Complications:   Dimension 2:  Description of patient's biomedical conditions and  complications: none  Dimension 3:  Emotional, Behavioral, or Cognitive Conditions and Complications:  Dimension 3:  Description of emotional, behavioral, or cognitive conditions and complications: dealing with grief after losing his mother and brother  Dimension 4:  Readiness to Change:  Dimension 4:  Description of Readiness to Change criteria: does not plan to quit using  Dimension 5:  Relapse, Continued use, or Continued Problem Potential:  Dimension 5:  Relapse, continued use, or continued problem  potential critiera description: does not plan to quit using  Dimension 6:  Recovery/Living Environment:  Dimension 6:  Recovery/Iiving environment criteria description: stable housing  ASAM Severity Score: ASAM's Severity Rating Score: 8  ASAM Recommended Level of Treatment: ASAM Recommended Level of Treatment: Level I Outpatient Treatment   Substance use Disorder (SUD) Substance Use Disorder (SUD)  Checklist Symptoms of Substance Use: Evidence of tolerance, Presence of craving or strong urge to use, Substance(s) often taken in larger amounts or over longer times than was intended  Recommendations for  Services/Supports/Treatments: Recommendations for Services/Supports/Treatments Recommendations For Services/Supports/Treatments: Individual Therapy, Facility Based Crisis, CD-IOP Intensive Chemical Dependency Program  Discharge Disposition:    DSM5 Diagnoses: There are no problems to display for this patient.    Referrals to Alternative Service(s): Referred to Alternative Service(s):   Place:   Date:   Time:    Referred to Alternative Service(s):   Place:   Date:   Time:    Referred to Alternative Service(s):   Place:   Date:   Time:    Referred to Alternative Service(s):   Place:   Date:   Time:     Celedonio Miyamoto, LCSW

## 2021-04-29 NOTE — ED Provider Notes (Signed)
Behavioral Health Urgent Care Medical Screening Exam  Patient Name: Christian Andrews MRN: [id number] Date of Evaluation: 04/29/21 Chief Complaint:   Diagnosis:  Final diagnoses:  Grief  Adjustment disorder, unspecified type  Alcohol use  Cocaine use    History of Present illness: Christian Andrews is a 40 y.o. male with a history of substance use (alcohol and cocaine) who presents to the [facility] for assessment after calling the suicide hotline. Patient interviewed in conjunction with TTS. Pt indicates that he called the suicide hotline looking for resources and that he did not think that he would have police sent to his house to bring him to the hospital. He adamently denies SI, plan or intent and reports feeling overwhelmed the last 3 months. He states that his mother died from COPD 3 months ago and that ~3 days later his brother who uses heroin regularly completed suicide by hanging himself. Pt states that he has attempted to go to grief counseling but that after 2 sessions he discontinued because he and the counselor did not mesh well. He states that he has been utilizing a 24 hour ministry talk line which has been "great". He states that he called more frequently at first but has been needing to call less; he last called them last week. He states that his mother was an organ donor and that he received from mail includign a blanket that are assocaited with organ donation and that this was a trigger for him calling the siucude hotline. He states that his parentes sepreateted when he was [age] years old and that it was alsoways he, his brother and his mother. He reports stress related to dealing with finances and his mother's estate and expresses difficulty about finding time to properly grieve his mother. He states that he is employed but is concerned that he will not be able to cover all the expenses. He denies SI/HI/AVH and is able to contract for safety. He states that he has had difficulty finding  resources as he is uninsured. Open access and other resources were dicussed with him and he is amenable. TTS staff member placed resources in AVS and patient requested letter for work to return on Sunday.  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 Uses on and off since he was 40 yo Use of Alcohol:  5-6 beers daily Rehab History: no H/O Complicated Withdrawal: no  Legal History: Past Charges/Incarcerations: no Pending charges: no  Family Psychiatric History: Brother-heroin use, completed suicide Mother-bipolar disorder   Psychiatric Specialty Exam  Presentation  General Appearance:Appropriate for Environment; Bizarre  Eye Contact:Good  Speech:Clear and Coherent; Normal Rate  Speech Volume:Normal  Handedness:No data recorded  Mood and Affect  Mood:Euthymic  Affect:Appropriate; Congruent; Other (comment); Tearful (intermittently and appropriately tearful at times)   Thought Process  Thought Processes:Coherent; Goal Directed; Linear  Descriptions of Associations:Intact  Orientation:Full (Time, Place and Person)  Thought Content:WDL    Hallucinations:None  Ideas of Reference:None  Suicidal Thoughts:No  Homicidal Thoughts:No   Sensorium  Memory:Immediate Good; Recent Good; Remote Good  Judgment:Good  Insight:Good   Executive Functions  Concentration:Good  Attention  Span:Good  Recall:Good  Fund of Knowledge:Good  Language:Good   Psychomotor Activity  Psychomotor Activity:Normal   Assets  Assets:Communication Skills; Desire for Improvement; Resilience; Vocational/Educational   Sleep  Sleep:Fair  Number of hours:  No data recorded  No data recorded  Physical Exam: Physical Exam Constitutional:      Appearance: Normal appearance. He is normal weight.  HENT:     Head: Normocephalic and atraumatic.  Eyes:     Extraocular Movements: Extraocular movements intact.  Pulmonary:     Effort: Pulmonary effort is normal.  Neurological:     General: No focal deficit present.     Mental Status: He is alert and oriented to person, place, and time.  Psychiatric:        Attention and Perception: Attention and perception normal.        Speech: Speech normal.        Behavior: Behavior normal. Behavior is cooperative.        Thought Content: Thought content normal.   Review of Systems  Constitutional:  Negative for chills and fever.  HENT:  Negative for hearing loss.   Eyes:  Negative for discharge and redness.  Respiratory:  Negative for cough.   Cardiovascular:  Negative for chest pain.  Gastrointestinal:  Negative for abdominal pain.  Musculoskeletal:  Negative for myalgias.  Neurological:  Negative for headaches.  Psychiatric/Behavioral:  Positive for substance abuse. Negative for depression, hallucinations and suicidal ideas.   Blood pressure (!) 137/94, pulse 92, temperature 98.6 F (37 C), temperature source Oral, resp. rate 16, SpO2 100 %. There is no height or weight on file to calculate BMI.  Musculoskeletal: Strength & Muscle Tone: within normal limits Gait & Station: normal Patient leans: N/A   [facility] MSE Discharge Disposition for Follow up and Recommendations: Based on my evaluation the patient does not appear to have an emergency medical condition and can be discharged with resources and follow up care in outpatient services  for Medication Management, Individual Therapy, and grief conseloung. Resources placed In AVS by TTS staff member.  Safety planning done with patient and he is agreeable to present to [facility] or ED or call 911 if experiencing suicidal thoughts.    Estella Husk, MD 04/29/2021, 3:02 PM

## 2021-09-20 ENCOUNTER — Encounter (HOSPITAL_COMMUNITY): Payer: Self-pay

## 2021-09-20 ENCOUNTER — Emergency Department (HOSPITAL_COMMUNITY)
Admission: EM | Admit: 2021-09-20 | Discharge: 2021-09-20 | Disposition: A | Discharge status: Home / Self Care | Payer: Self-pay | Attending: Emergency Medicine | Admitting: Emergency Medicine

## 2021-09-20 ENCOUNTER — Emergency Department (HOSPITAL_COMMUNITY): Payer: Self-pay

## 2021-09-20 DIAGNOSIS — F17210 Nicotine dependence, cigarettes, uncomplicated: Secondary | ICD-10-CM | POA: Insufficient documentation

## 2021-09-20 DIAGNOSIS — S20212A Contusion of left front wall of thorax, initial encounter: Secondary | ICD-10-CM | POA: Insufficient documentation

## 2021-09-20 DIAGNOSIS — W07XXXA Fall from chair, initial encounter: Secondary | ICD-10-CM | POA: Insufficient documentation

## 2021-09-20 MED ORDER — IBUPROFEN 600 MG PO TABS
600.0000 mg | ORAL_TABLET | Freq: Four times a day (QID) | ORAL | 0 refills | Status: DC | PRN
Start: 2021-09-20 — End: 2021-09-20

## 2021-09-20 MED ORDER — HYDROCODONE-ACETAMINOPHEN 5-325 MG PO TABS
1.0000 | ORAL_TABLET | Freq: Once | ORAL | Status: AC
  Administered 2021-09-20: 1 via ORAL
  Filled 2021-09-20: qty 1

## 2021-09-20 MED ORDER — IBUPROFEN 800 MG PO TABS
800.0000 mg | ORAL_TABLET | Freq: Once | ORAL | Status: AC
  Administered 2021-09-20: 800 mg via ORAL
  Filled 2021-09-20: qty 1

## 2021-09-20 MED ORDER — HYDROCODONE-ACETAMINOPHEN 5-325 MG PO TABS: 1.0000 | ORAL_TABLET | ORAL | 0 refills | Status: DC | PRN

## 2021-09-20 MED ORDER — IBUPROFEN 600 MG PO TABS: 600.0000 mg | ORAL_TABLET | Freq: Four times a day (QID) | ORAL | 0 refills | Status: DC | PRN

## 2021-09-20 NOTE — ED Triage Notes (Addendum)
Pt states he tripped and fell last night. Pt states he struck the left side of his ribs on a chair. Pt reports pain and sob has worsened since the fall.

## 2021-09-20 NOTE — ED Provider Notes (Signed)
[facility]EM[facility]ENCY DEPT Provider Note   CSN: [id number] Arrival date & time: 09/20/21  1539     History Chief Complaint  Patient presents with   Fall   Rib Injury    Christian Andrews is a 40 y.o. male.  Pt presents to the ED today with left rib pain.  Pt said he tripped and fell last night over a fan.  He fell into a chair.  He has pain to his left chest.  It hurts when he moves and when he takes a deep breath or coughs.  No other injuries.      History reviewed. No pertinent past medical history.  There are no problems to display for this patient.   History reviewed. No pertinent surgical history.     History reviewed. No pertinent family history.  Social History   Tobacco Use   Smoking status: Some Days    Packs/day: 1.00    Types: Cigarettes   Smokeless tobacco: Never  Substance Use Topics   Alcohol use: Yes   Drug use: Never    Home Medications Prior to Admission medications   Medication Sig Start Date End Date Taking? Authorizing Provider  HYDROcodone-acetaminophen (NORCO/VICODIN) 5-325 MG tablet Take 1 tablet by mouth every 4 (four) hours as needed. 09/20/21  Yes Jacalyn Lefevre, MD  ibuprofen (ADVIL) 600 MG tablet Take 1 tablet (600 mg total) by mouth every 6 (six) hours as needed. 09/20/21  Yes Jacalyn Lefevre, MD  clindamycin (CLEOCIN) 150 MG capsule Take 1 capsule (150 mg total) by mouth every 6 (six) hours. 02/25/18   Gwyneth Sprout, MD    Allergies    Patient has no known allergies.  Review of Systems   Review of Systems  Musculoskeletal:        Left rib pain  All other systems reviewed and are negative.  Physical Exam Updated Vital Signs BP (!) 140/104   Pulse 88   Temp 98 F (36.7 C) (Oral)   Resp 18   Ht 6\' 2"  (1.88 m)   Wt 74.8 kg   SpO2 100%   BMI 21.18 kg/m   Physical Exam Vitals and nursing note reviewed.  Constitutional:      Appearance: Normal appearance.  HENT:     Head: Normocephalic and  atraumatic.     Right Ear: External ear normal.     Left Ear: External ear normal.     Nose: Nose normal.     Mouth/Throat:     Mouth: Mucous membranes are moist.     Pharynx: Oropharynx is clear.  Eyes:     Extraocular Movements: Extraocular movements intact.     Conjunctiva/sclera: Conjunctivae normal.     Pupils: Pupils are equal, round, and reactive to light.  Cardiovascular:     Rate and Rhythm: Normal rate and regular rhythm.     Pulses: Normal pulses.     Heart sounds: Normal heart sounds.  Pulmonary:     Effort: Pulmonary effort is normal.     Breath sounds: Normal breath sounds.  Chest:    Abdominal:     General: Abdomen is flat. Bowel sounds are normal.     Palpations: Abdomen is soft.  Musculoskeletal:        General: Normal range of motion.     Cervical back: Normal range of motion and neck supple.  Skin:    General: Skin is warm.     Capillary Refill: Capillary refill takes less than 2 seconds.  Neurological:  General: No focal deficit present.     Mental Status: He is alert and oriented to person, place, and time.  Psychiatric:        Mood and Affect: Mood normal.        Behavior: Behavior normal.    ED Results / Procedures / Treatments   Labs (all labs ordered are listed, but only abnormal results are displayed) Labs Reviewed - No data to display  EKG None  Radiology DG Chest 2 View  Result Date: 09/20/2021 CLINICAL DATA:  Left rib pain after fall last night. EXAM: CHEST - 2 VIEW COMPARISON:  June 26, 2008. FINDINGS: The heart size and mediastinal contours are within normal limits. Both lungs are clear. The visualized skeletal structures are unremarkable. IMPRESSION: No active cardiopulmonary disease. Electronically Signed   By: Lupita Raider M.D.   On: 09/20/2021 16:40    Procedures Procedures   Medications Ordered in ED Medications  HYDROcodone-acetaminophen (NORCO/VICODIN) 5-325 MG per tablet 1 tablet (has no administration in time  range)  ibuprofen (ADVIL) tablet 800 mg (has no administration in time range)    ED Course  I have reviewed the triage vital signs and the nursing notes.  Pertinent labs & imaging results that were available during my care of the patient were reviewed by me and considered in my medical decision making (see chart for details).    MDM Rules/Calculators/A&P                           CXR is negative, but clinically; he has a rib fx.  Pt is given an incentive spirometer and is given ibuprofen/lortab.  He works at [employer] and is given a note for his work.  Pt knows to return if pain worsens.   Final Clinical Impression(s) / ED Diagnoses Final diagnoses:  Contusion of rib on left side, initial encounter    Rx / DC Orders ED Discharge Orders          Ordered    HYDROcodone-acetaminophen (NORCO/VICODIN) 5-325 MG tablet  Every 4 hours PRN        09/20/21 2146    ibuprofen (ADVIL) 600 MG tablet  Every 6 hours PRN        09/20/21 2146             Jacalyn Lefevre, MD 09/20/21 2151

## 2022-01-10 IMAGING — CR DG CHEST 2V
2 series · 2 of 2 positions shown · non-contrast
Comparison: June 26, 2008.

CLINICAL DATA: Left rib pain after fall last night.

EXAM:
CHEST - 2 VIEW

[w chest pa]
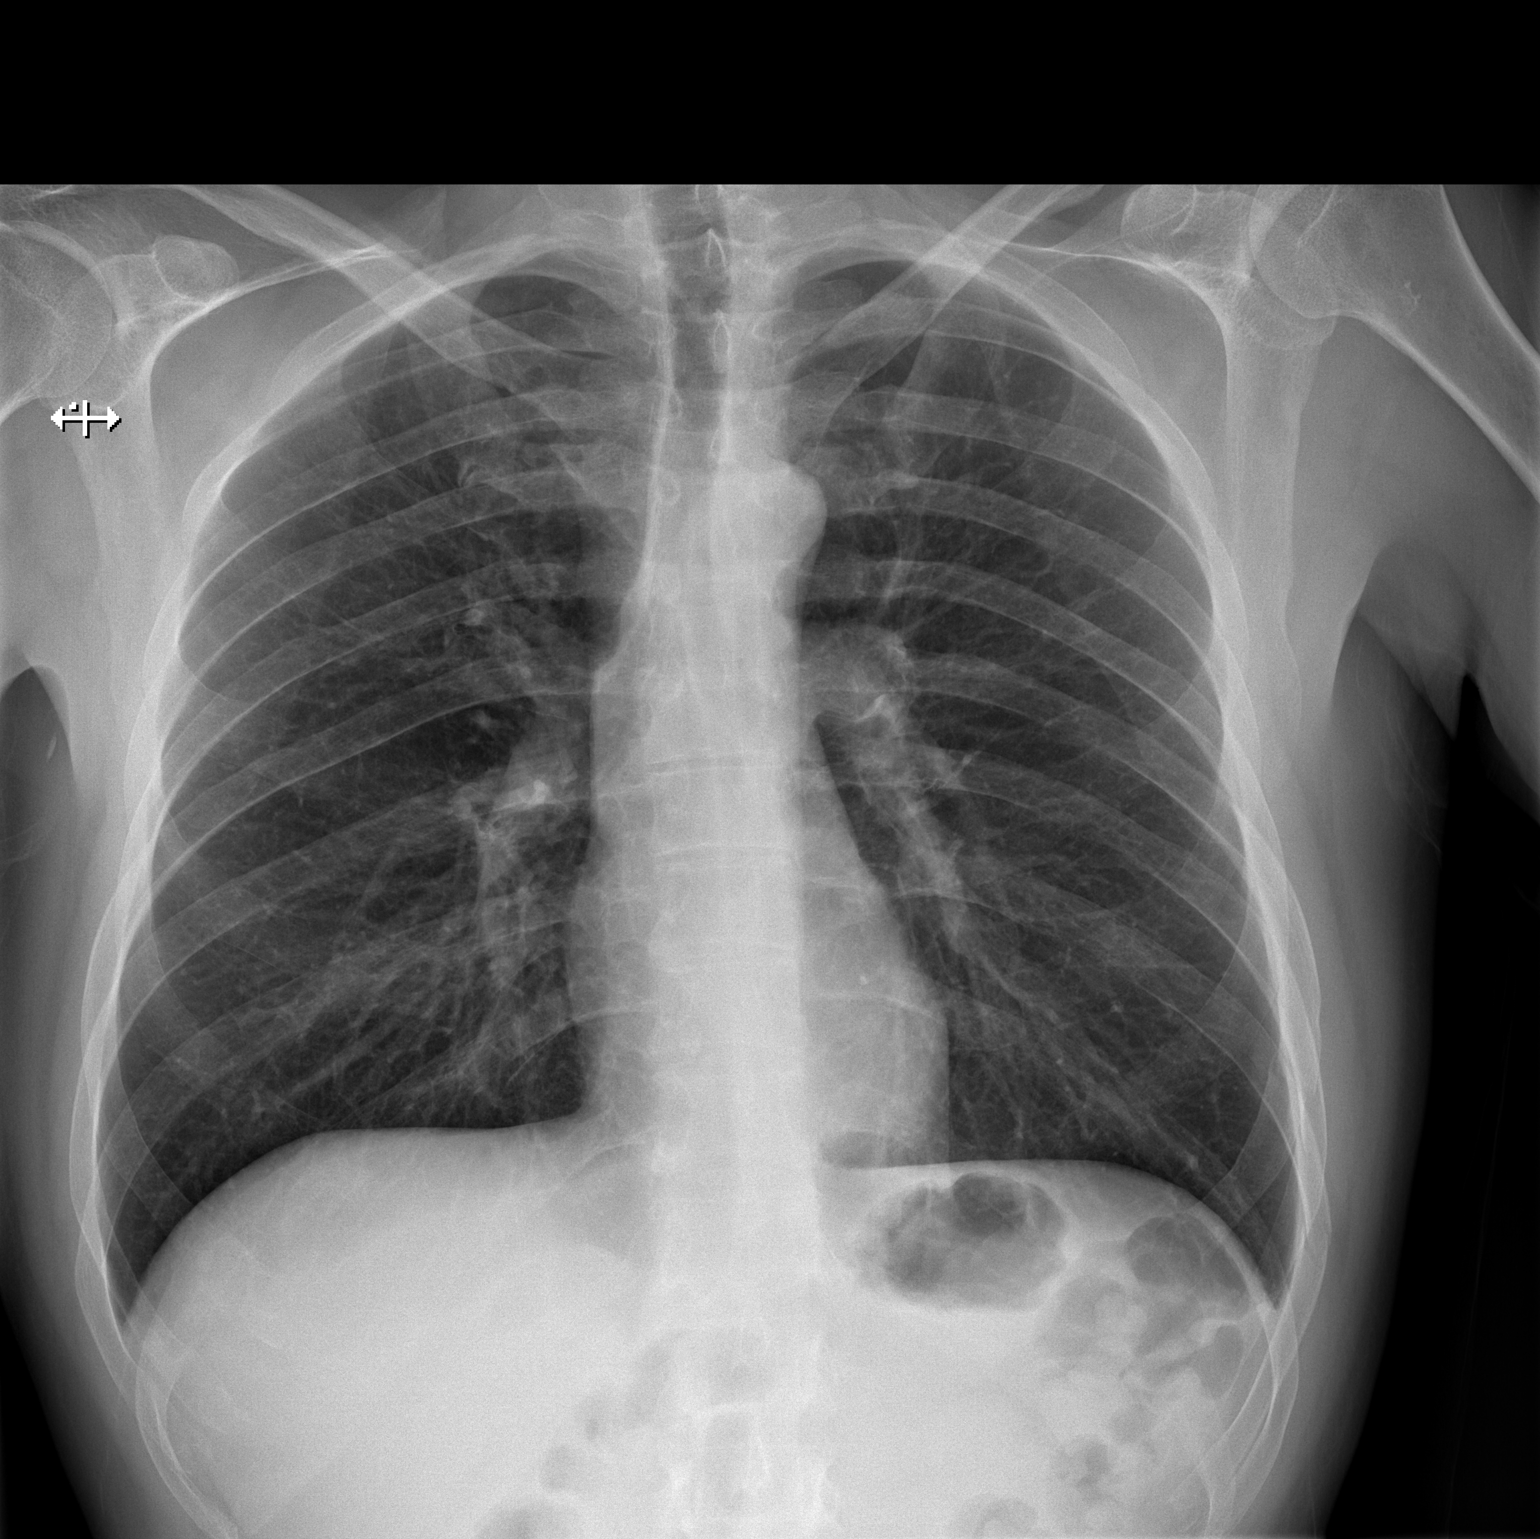

[w chest lat]
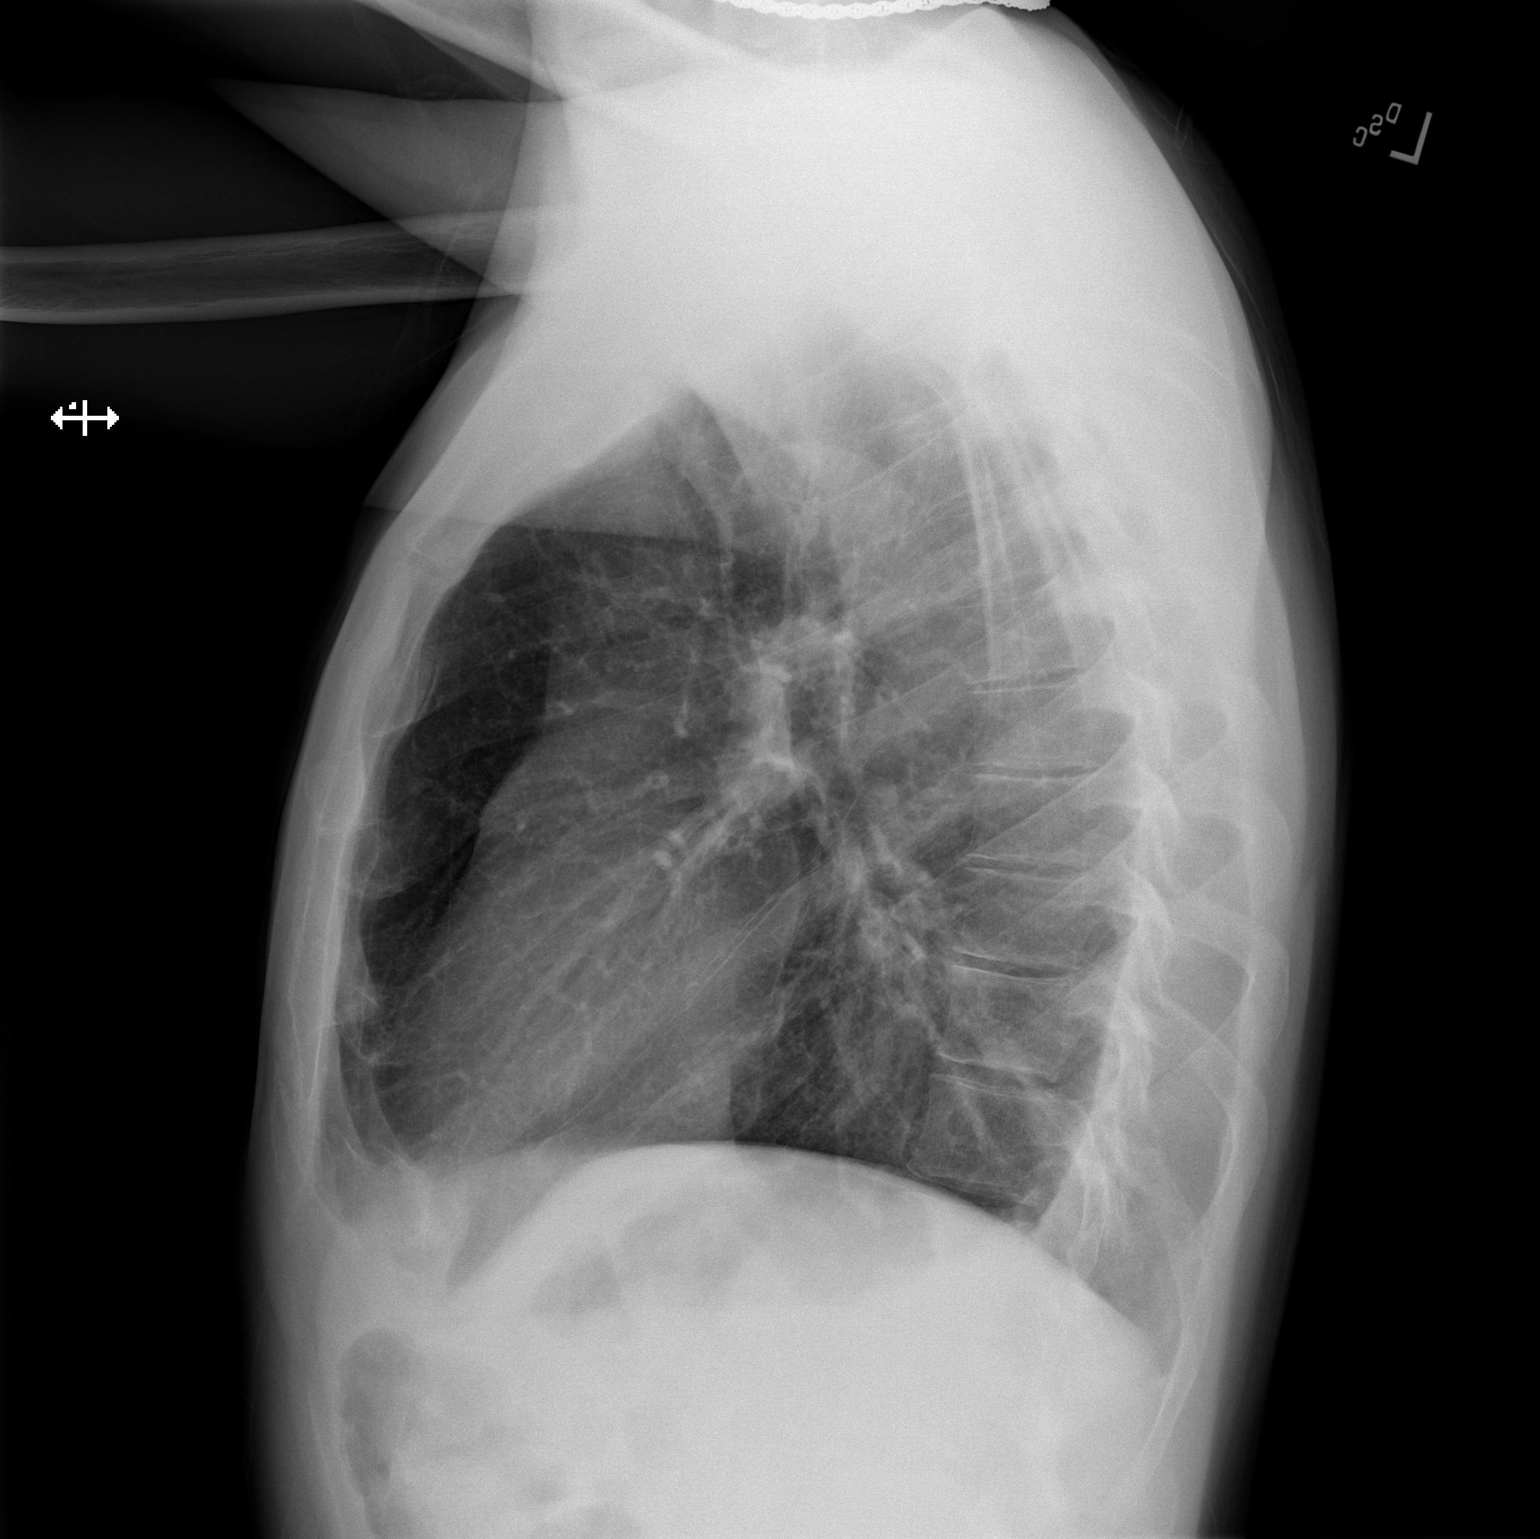

[2 of 2 positions shown; findings below may reference images not displayed]

FINDINGS: The heart size and mediastinal contours are within normal limits.
Both lungs are clear. The visualized skeletal structures are
unremarkable.
IMPRESSION: No active cardiopulmonary disease.

## 2022-04-22 ENCOUNTER — Ambulatory Visit (HOSPITAL_COMMUNITY)
Admission: EM | Admit: 2022-04-22 | Discharge: 2022-04-22 | Disposition: A | Discharge status: Home / Self Care | Payer: No Payment, Other | Attending: Behavioral Health | Admitting: Behavioral Health

## 2022-04-22 DIAGNOSIS — F331 Major depressive disorder, recurrent, moderate: Secondary | ICD-10-CM | POA: Insufficient documentation

## 2022-04-22 NOTE — ED Triage Notes (Signed)
Pt presents to [facility] escorted by [employer] voluntarily seeking therapy resources and substance use treatment resources. Pt states he has been struggling with depression symptoms since the passing of his mother and brother last year. Pt states he did not seek help at the time and never really processed their death. Pt states that he just would like to talk to a therapist and get set up with outpatient services. Pt states he struggles with substance use as well but he has been clean for 36 days and would like to get services to assist him in maintaining his sobriety. Pt denies SI/HI,NSSIB, substance or alcohol use and AVH.

## 2022-04-22 NOTE — Discharge Instructions (Signed)
Discharge recommendations:   Please see information for follow-up appointment with psychiatry and therapy.  Please follow up with your primary care provider for all medical related needs.   Therapy: We recommend that patient participate in individual therapy to address mental health concerns.  Safety:  The patient should abstain from use of illicit substances/drugs and abuse of any medications. If symptoms worsen or do not continue to improve or if the patient becomes actively suicidal or homicidal then it is recommended that the patient return to the closest hospital emergency department, the [facility], or call 911 for further evaluation and treatment. National Suicide Prevention Lifeline 1-800-SUICIDE or 1-800-273-8255.  About 988 988 offers 24/7 access to trained crisis counselors who can help people experiencing mental health-related distress. People can call or text 988 or chat 988lifeline.org for themselves or if they are worried about a loved one who may need crisis support.   Please contact one of the following facilities to start medication management and therapy services:   [facility] [address] (SECOND FLOOR)  [place], [place]  [address] Phone: [phone]  [facility]  [address] [place], [place] [address] Phone: [phone]  [facility] [place]  [address]  [place], [place] [address]  [facility] [place]  [address]  [place],  [address] Phone: [phone]      

## 2022-04-22 NOTE — ED Provider Notes (Signed)
Behavioral Health Urgent Care Medical Screening Exam  Patient Name: Christian Andrews MRN: [id number] Date of Evaluation: 04/22/22 Diagnosis:  Final diagnoses:  Moderate episode of recurrent major depressive disorder (HCC)    History of Present illness: Christian Andrews is a 41 y.o. male patient who presents to the [facility] behavioral health urgent care voluntary accompanied by [employer] with a chief complaint of seeking therapy for depression. He states that he called police because he doesn't have transportation.   Patient seen and evaluated face-to-face by this provider, and chart reviewed. On evaluation, patient is alert and oriented x4. His thought process is logical and goal oriented. His speech is clear and coherent.  His mood is depressed and affect is congruent. He has fair eye contact. He appears casually dressed.   He reports feeling depressed since his mother and brother passed away 2020/02/05. He states that they that died 3 days apart. He describes his depressive symptoms as feeling hopeless, difficulty getting out of bed, decreased motivation, poor sleep and a lack of drive. He is currently seeking therapy to help process the death of his mother and brother as he has never received grief counseling in the past. He denies a past psychiatric history. He denies past psychiatric services or therapy. He reports using cocaine and alcohol in the past and states that he's been sober for the past 35 days. He states that he stopped cold [place]. He denies past and current substance abuse treatment.  He denies suicidal ideations. He denies homicidal ideations. He denies auditory or visual hallucinations.  There is no objective evidence that the patient is currently responding to internal or 60 stimuli. He reports that he resides alone in his house. He states that he took a leave of absence from his job at [employer] to get clean from drugs. 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 He denies access to weapons, including guns in his home. He identifies his father as his support person.  I discussed with the patient following up with outpatient psychiatry services for therapy and/or medication management for depressive symptoms. Patient advised of the open access hours here at the [facility] behavioral health outpatient services for therapy and medication management and resources provided for substance abuse treatment. Patient verbalizes understanding and agrees to the stated plan.   Psychiatric Specialty Exam  Presentation  General Appearance:Appropriate for Environment  Eye Contact:Fair  Speech:Clear and Coherent  Speech Volume:Normal  Handedness:No data recorded  Mood and Affect  Mood:Euthymic  Affect:Depressed   Thought Process  Thought Processes:Coherent; Goal Directed  Descriptions of Associations:Intact  Orientation:Full (Time, Place and Person)  Thought Content:Logical  Diagnosis of Schizophrenia or Schizoaffective disorder in past: No   Hallucinations:None  Ideas of Reference:None  Suicidal Thoughts:No  Homicidal Thoughts:No   Sensorium  Memory:Immediate Fair; Remote Fair; Recent Fair  Judgment:Fair  Insight:Fair   Executive Functions  Concentration:Fair  Attention Span:Fair  Recall:Fair  Fund of Knowledge:Fair  Language:Fair   Psychomotor Activity  Psychomotor Activity:Normal   Assets  Assets:Communication Skills; Desire for Improvement; Housing; Leisure Time; Physical Health; Resilience; Social Support   Sleep  Sleep:Fair  Number of hours: No data recorded  No data recorded  Physical Exam: Physical Exam HENT:     Head: Normocephalic.     Nose: Nose normal.  Eyes:     Conjunctiva/sclera: Conjunctivae normal.  Cardiovascular:     Rate and  Rhythm: Normal rate.  Pulmonary:     Effort: Pulmonary effort is normal.  Musculoskeletal:         General: Normal range of motion.     Cervical back: Normal range of motion.  Neurological:     Mental Status: He is alert and oriented to person, place, and time.    Review of Systems  Constitutional: Negative.   HENT: Negative.    Eyes: Negative.   Cardiovascular: Negative.   Gastrointestinal: Negative.   Genitourinary: Negative.   Musculoskeletal: Negative.   Skin: Negative.   Neurological: Negative.   Endo/Heme/Allergies: Negative.    Blood pressure 92/70, pulse 82, temperature 98.4 F (36.9 C), temperature source Oral, resp. rate 18, SpO2 100 %. There is no height or weight on file to calculate BMI.  Musculoskeletal: Strength & Muscle Tone: within normal limits Gait & Station: normal Patient leans: N/A   [facility]UC MSE Discharge Disposition for Follow up and Recommendations: Based on my evaluation the patient does not appear to have an emergency medical condition and can be discharged with resources and follow up care in outpatient services for Medication Management, Individual Therapy, and Group Therapy   Follow-up Information     [facility].   Specialty: Urgent Care Why: Walk-in hours for open access for psychiatry are Mondays, Wednesdays and Fridays 8 am to 11 pm. Please arrive at 7:30 am. Open access for therapy are Mondays, Tuesdays, and Wednesdays from 8:00 am to 11:00 pm. Please arrive at 7:30 am Contact information: [address] [place] [address] [phone]        Call  [facility].   Why: Please contact if you are interested in any outpatient substance abuse services. Contact information: [address] [place], [place] [address]  Office: [phone]  Fax: [phone]                Layla Barter, NP 04/22/2022, 4:21 PM

## 2022-04-22 NOTE — ED Notes (Signed)
Patient was discharged by the provider. Patient was given discharge instructions including community resources. Patient was given a bus pass for transportation to home.

## 2022-04-25 ENCOUNTER — Ambulatory Visit (HOSPITAL_COMMUNITY)
Admission: EM | Admit: 2022-04-25 | Discharge: 2022-04-26 | Disposition: A | Discharge status: Other Acute Inpatient Hospital | Payer: No Payment, Other | Attending: Psychiatry | Admitting: Psychiatry

## 2022-04-25 ENCOUNTER — Encounter (HOSPITAL_COMMUNITY): Payer: Self-pay | Admitting: Emergency Medicine

## 2022-04-25 ENCOUNTER — Other Ambulatory Visit: Payer: Self-pay

## 2022-04-25 DIAGNOSIS — R45851 Suicidal ideations: Secondary | ICD-10-CM | POA: Insufficient documentation

## 2022-04-25 DIAGNOSIS — F322 Major depressive disorder, single episode, severe without psychotic features: Secondary | ICD-10-CM | POA: Insufficient documentation

## 2022-04-25 DIAGNOSIS — Z20822 Contact with and (suspected) exposure to covid-19: Secondary | ICD-10-CM | POA: Insufficient documentation

## 2022-04-25 DIAGNOSIS — Z818 Family history of other mental and behavioral disorders: Secondary | ICD-10-CM | POA: Diagnosis not present

## 2022-04-25 DIAGNOSIS — Z9151 Personal history of suicidal behavior: Secondary | ICD-10-CM | POA: Insufficient documentation

## 2022-04-25 LAB — CBC WITH DIFFERENTIAL/PLATELET
Abs Immature Granulocytes: 0.02 10*3/uL (ref 0.00–0.07)
Basophils Absolute: 0.1 10*3/uL (ref 0.0–0.1)
Basophils Relative: 1 %
Eosinophils Absolute: 0.3 10*3/uL (ref 0.0–0.5)
Eosinophils Relative: 5 %
HCT: 38.5 % — ABNORMAL LOW (ref 39.0–52.0)
Hemoglobin: 12.7 g/dL — ABNORMAL LOW (ref 13.0–17.0)
Immature Granulocytes: 0 %
Lymphocytes Relative: 22 %
Lymphs Abs: 1.4 10*3/uL (ref 0.7–4.0)
MCH: 32 pg (ref 26.0–34.0)
MCHC: 33 g/dL (ref 30.0–36.0)
MCV: 97 fL (ref 80.0–100.0)
Monocytes Absolute: 0.4 10*3/uL (ref 0.1–1.0)
Monocytes Relative: 6 %
Neutro Abs: 4.4 10*3/uL (ref 1.7–7.7)
Neutrophils Relative %: 66 %
Platelets: 236 10*3/uL (ref 150–400)
RBC: 3.97 MIL/uL — ABNORMAL LOW (ref 4.22–5.81)
RDW: 12.5 % (ref 11.5–15.5)
WBC: 6.6 10*3/uL (ref 4.0–10.5)
nRBC: 0 % (ref 0.0–0.2)

## 2022-04-25 LAB — LIPID PANEL
Cholesterol: 183 mg/dL (ref 0–200)
HDL: 55 mg/dL (ref >40)
LDL Cholesterol: 109 mg/dL — ABNORMAL HIGH (ref 0–99)
Total CHOL/HDL Ratio: 3.3 RATIO
Triglycerides: 96 mg/dL (ref <150)
VLDL: 19 mg/dL (ref 0–40)

## 2022-04-25 LAB — COMPREHENSIVE METABOLIC PANEL
ALT: 18 U/L (ref 0–44)
AST: 21 U/L (ref 15–41)
Albumin: 3.8 g/dL (ref 3.5–5.0)
Alkaline Phosphatase: 38 U/L (ref 38–126)
Anion gap: 7 (ref 5–15)
BUN: 8 mg/dL (ref 6–20)
CO2: 23 mmol/L (ref 22–32)
Calcium: 9 mg/dL (ref 8.9–10.3)
Chloride: 108 mmol/L (ref 98–111)
Creatinine, Ser: 0.63 mg/dL (ref 0.61–1.24)
GFR, Estimated: >60 mL/min (ref >60)
Glucose, Bld: 93 mg/dL (ref 70–99)
Potassium: 3.7 mmol/L (ref 3.5–5.1)
Sodium: 138 mmol/L (ref 135–145)
Total Bilirubin: 0.6 mg/dL (ref 0.3–1.2)
Total Protein: 6.2 g/dL — ABNORMAL LOW (ref 6.5–8.1)

## 2022-04-25 LAB — POCT URINE DRUG SCREEN - MANUAL ENTRY (I-SCREEN)
POC Amphetamine UR: NOT DETECTED
POC Buprenorphine (BUP): NOT DETECTED
POC Cocaine UR: NOT DETECTED
POC Marijuana UR: NOT DETECTED
POC Methadone UR: NOT DETECTED
POC Methamphetamine UR: NOT DETECTED
POC Morphine: NOT DETECTED
POC Oxazepam (BZO): NOT DETECTED
POC Oxycodone UR: NOT DETECTED
POC Secobarbital (BAR): NOT DETECTED

## 2022-04-25 LAB — TSH: TSH: 1.527 u[IU]/mL (ref 0.350–4.500)

## 2022-04-25 LAB — URINALYSIS, COMPLETE (UACMP) WITH MICROSCOPIC
Bacteria, UA: NONE SEEN
Bilirubin Urine: NEGATIVE
Glucose, UA: NEGATIVE mg/dL
Hgb urine dipstick: NEGATIVE
Ketones, ur: NEGATIVE mg/dL
Leukocytes,Ua: NEGATIVE
Nitrite: NEGATIVE
Protein, ur: NEGATIVE mg/dL
Specific Gravity, Urine: 1.002 — ABNORMAL LOW (ref 1.005–1.030)
pH: 5 (ref 5.0–8.0)

## 2022-04-25 LAB — RESP PANEL BY RT-PCR (FLU A&B, COVID) ARPGX2
Influenza A by PCR: NEGATIVE
Influenza B by PCR: NEGATIVE
SARS Coronavirus 2 by RT PCR: NEGATIVE

## 2022-04-25 LAB — POC SARS CORONAVIRUS 2 AG: SARSCOV2ONAVIRUS 2 AG: NEGATIVE

## 2022-04-25 LAB — HEMOGLOBIN A1C
Hgb A1c MFr Bld: 5.7 % — ABNORMAL HIGH (ref 4.8–5.6)
Mean Plasma Glucose: 116.89 mg/dL

## 2022-04-25 LAB — MAGNESIUM: Magnesium: 2 mg/dL (ref 1.7–2.4)

## 2022-04-25 LAB — POC SARS CORONAVIRUS 2 AG -  ED: SARS Coronavirus 2 Ag: NEGATIVE

## 2022-04-25 LAB — ETHANOL: Alcohol, Ethyl (B): <10 mg/dL (ref <10)

## 2022-04-25 MED ORDER — ACETAMINOPHEN 325 MG PO TABS: 650.0000 mg | ORAL_TABLET | Freq: Four times a day (QID) | ORAL | Status: DC | PRN

## 2022-04-25 MED ORDER — NICOTINE 21 MG/24HR TD PT24
21.0000 mg | MEDICATED_PATCH | Freq: Every day | TRANSDERMAL | Status: DC
  Administered 2022-04-25 – 2022-04-26 (×2): 21 mg via TRANSDERMAL
  Filled 2022-04-25 (×2): qty 1

## 2022-04-25 MED ORDER — HYDROXYZINE HCL 25 MG PO TABS
25.0000 mg | ORAL_TABLET | Freq: Three times a day (TID) | ORAL | Status: DC | PRN
  Administered 2022-04-25: 25 mg via ORAL
  Filled 2022-04-25: qty 1

## 2022-04-25 MED ORDER — ALUM & MAG HYDROXIDE-SIMETH 200-200-20 MG/5ML PO SUSP: 30.0000 mL | ORAL | Status: DC | PRN

## 2022-04-25 MED ORDER — TRAZODONE HCL 50 MG PO TABS
50.0000 mg | ORAL_TABLET | Freq: Every evening | ORAL | Status: DC | PRN
  Administered 2022-04-25: 50 mg via ORAL
  Filled 2022-04-25: qty 1

## 2022-04-25 MED ORDER — ESCITALOPRAM OXALATE 10 MG PO TABS
10.0000 mg | ORAL_TABLET | Freq: Every day | ORAL | Status: DC
  Administered 2022-04-26: 10 mg via ORAL
  Filled 2022-04-25: qty 1

## 2022-04-25 MED ORDER — MAGNESIUM HYDROXIDE 400 MG/5ML PO SUSP: 30.0000 mL | Freq: Every day | ORAL | Status: DC | PRN

## 2022-04-25 NOTE — ED Notes (Signed)
Pt sleeping at present, no distress noted,  Respirations even & unlabored.  Monitoring for safety. °

## 2022-04-25 NOTE — Progress Notes (Signed)
Received [name] in the assessment room, he was cooperative with the EKG. His personal belongings were secured and he was relocated to the OBS unit. He was oriented to his new environment and received nourishments per his choice.He endorsed feeling anxious and depressed at the present time, but denied feeling suicidal.  He is calm and cooperative at this time.Christian Andrews

## 2022-04-25 NOTE — BH Assessment (Signed)
Comprehensive Clinical Assessment (CCA) Note  04/25/2022 Kendan Andrews [id number]  Chief Complaint: Depression, suicidal ideation with a plan to jump off bridge.   Chief Complaint  Patient presents with   Depression    41 year old Christian Andrews present to [facility] voluntary by [employer] reporting depressive symptoms and suicidal ideations with a plan to jump off a building.    Visit Diagnosis: Depression                             Alcohol Use Disorder                             Stimulant Use Disorder (cocaine)    CCA Screening, Triage and Referral (STR)  Patient Reported Information How did you hear about [place]? Legal System  What Is the Reason for Your Visit/Call Today? 41 year old Christian Andrews present to [facility] voluntary by [employer] reporting depressive symptoms and suicidal ideations with a plan to jump off a building. Depressive symptoms feelings of hopelessness, worthlessness, an anxiety. Report depressive symptoms triggered by discontinue use of substances. Report he stopped abusing alcohol and cocaine and has been sober for 40 days. Report he think he missed up by detoxing on his own and alone. Currenlty denies withdrawal symptoms but continues to report urges. Denied homicidal ideations and denied auditory/visual hallucinations. Patient seen at [facility] on 04/22/2022 requesting OPT resources reporting depressive symptoms triggered unresolved grief.  How Long Has This Been Causing You Problems? 1 wk - 1 month  What Do You Feel Would Help You the Most Today? Alcohol or Drug Use Treatment; Stress Management   Have You Recently Had Any Thoughts About Hurting Yourself? No  Are You Planning to Commit Suicide/Harm Yourself At This time? No   Have you Recently Had Thoughts About Hurting Someone Christian Andrews? No  Are You Planning to Harm Someone at This Time? No  Explanation: No data recorded  Have You Used Any Alcohol or Drugs in the Past 24 Hours? No  How Long Ago Did You Use Drugs or Alcohol? No data  recorded What Did You Use and How Much? alcohol and cocaine   Do You Currently Have a Therapist/Psychiatrist? No  Name of Therapist/Psychiatrist: No data recorded  Have You Been Recently Discharged From Any Office Practice or Programs? No  Explanation of Discharge From Practice/Program: No data recorded    CCA Screening Triage Referral Assessment Type of Contact: Face-to-Face  Telemedicine Service Delivery:   Is this Initial or Reassessment? No data recorded Date Telepsych consult ordered in [facility]:  No data recorded Time Telepsych consult ordered in [facility]:  No data recorded Location of Assessment: [facility] Assessment Services  Provider Location: [facility] Assessment Services   Collateral Involvement: NA   Does Patient Have a Automotive engineer Guardian? No data recorded Name and Contact of Legal Guardian: No data recorded If Minor and Not Living with Parent(s), Who has Custody? No data recorded Is CPS involved or ever been involved? Never  Is APS involved or ever been involved? Never   Patient Determined To Be At Risk for Harm To Self or Others Based on Review of Patient Reported Information or Presenting Complaint? No  Method: No data recorded Availability of Means: No data recorded Intent: No data recorded Notification Required: No data recorded Additional Information for Danger to Others Potential: No data recorded Additional Comments for Danger to Others Potential: No data recorded Are  There Guns or Other Weapons in Your Home? No data recorded Types of Guns/Weapons: No data recorded Are These Weapons Safely Secured?                            No data recorded Who Could Verify You Are Able To Have These Secured: No data recorded Do You Have any Outstanding Charges, Pending Court Dates, Parole/Probation? No data recorded Contacted To Inform of Risk of Harm To Self or Others: No data recorded   Does Patient Present under Involuntary Commitment? No  IVC Papers Initial  File Date: No data recorded  [place] of Residence: No data recorded  Patient Currently Receiving the Following Services: Not Receiving Services   Determination of Need: Emergent (2 hours)   Options For Referral: Inpatient Hospitalization     CCA Biopsychosocial Patient Reported Schizophrenia/Schizoaffective Diagnosis in Past: No   Strengths: has good insight, invested in treatment   Mental Health Symptoms Depression:   Change in energy/activity; Difficulty Concentrating; Irritability; Sleep (too much or little); Tearfulness   Duration of Depressive symptoms:  Duration of Depressive Symptoms: Greater than two weeks   Mania:   None   Anxiety:    None   Psychosis:   None   Duration of Psychotic symptoms:    Trauma:   None   Obsessions:   None   Compulsions:   None   Inattention:   None   Hyperactivity/Impulsivity:   None   Oppositional/Defiant Behaviors:   None   Emotional Irregularity:   None   Other Mood/Personality Symptoms:  No data recorded   Mental Status Exam Appearance and self-care  Stature:   Average   Weight:   Average weight   Clothing:   Neat/clean   Grooming:   Normal   Cosmetic use:   None   Posture/gait:   Normal   Motor activity:   Not Remarkable   Sensorium  Attention:   Normal   Concentration:   Normal   Orientation:   X5   Recall/memory:   Normal   Affect and Mood  Affect:   Depressed   Mood:   Depressed   Relating  Eye contact:   Normal   Facial expression:   Responsive   Attitude toward examiner:   Cooperative   Thought and Language  Speech flow:  Clear and Coherent   Thought content:   Appropriate to Mood and Circumstances   Preoccupation:   None   Hallucinations:   None   Organization:  No data recorded  [employer] of Knowledge:   Good   Intelligence:   Average   Abstraction:   Normal   Judgement:   Poor   Reality Testing:   Realistic    Insight:   Poor (suicidal ideations with a plan to jump off a building)   Decision Making:   Normal   Social Functioning  Social Maturity:   Responsible   Social Judgement:   Normal   Stress  Stressors:   Grief/losses; Financial   Coping Ability:   Normal   Skill Deficits:   None   Supports:   Support needed     Religion: Religion/Spirituality Are You A Religious Person?: No  Leisure/Recreation: Leisure / Recreation Do You Have Hobbies?: Yes Leisure and Hobbies: play pool, hobbies  Exercise/Diet: Exercise/Diet Do You Exercise?: Yes What Type of Exercise Do You Do?: Run/Walk How Many Times a Week Do You Exercise?: Daily Have You  Gained or Lost A Significant Amount of Weight in the Past Six Months?: No Do You Follow a Special Diet?: No Do You Have Any Trouble Sleeping?: Yes Explanation of Sleeping Difficulties: intermittent difficulty with sleep   CCA Employment/Education Employment/Work Situation: Employment / Work Situation Employment Situation: Unemployed Patient's Job has Been Impacted by Current Illness: No Has Patient ever Been in Equities trader?: No  Education: Education Is Patient Currently Attending School?: No Last Grade Completed: 12 Did You Product manager?: No Did You Have An Individualized Education Program (IIEP): No Did You Have Any Difficulty At [employer]?: No Patient's Education Has Been Impacted by Current Illness: No   CCA Family/Childhood History Family and Relationship History: Family history Marital status: Single Does patient have children?: No  Childhood History:  Childhood History By whom was/is the patient raised?: Mother Did patient suffer any verbal/emotional/physical/sexual abuse as a child?: No Did patient suffer from severe childhood neglect?: No Has patient ever been sexually abused/assaulted/raped as an adolescent or adult?: No Was the patient ever a victim of a crime or a disaster?: No Witnessed domestic  violence?: No Has patient been affected by domestic violence as an adult?: No  Child/Adolescent Assessment:     CCA Substance Use Alcohol/Drug Use: Alcohol / Drug Use Pain Medications: see MAR Prescriptions: see MAR Over the Counter: see MAR History of alcohol / drug use?: Yes Substance #1 Name of Substance 1: alcohol 1 - Age of First Use: 20's 1 - Amount (size/oz): 6 beers daily 1 - Frequency: daily 1 - Duration: May 2023 1 - Last Use / Amount: 6-beers daily 1- Route of Use: oral Substance #2 Name of Substance 2: cocaine 2 - Age of First Use: 18 2 - Amount (size/oz): 1 gram 2 - Frequency: weekly 2 - Duration: UTA 2 - Last Use / Amount: May 2023                     ASAM's:  Six Dimensions of Multidimensional Assessment  Dimension 1:  Acute Intoxication and/or Withdrawal Potential:      Dimension 2:  Biomedical Conditions and Complications:      Dimension 3:  Emotional, Behavioral, or Cognitive Conditions and Complications:  Dimension 3:  Description of emotional, behavioral, or cognitive conditions and complications: dealing with grief after losing his mother and brother, unemployed  Dimension 4:  Readiness to Change:  Dimension 4:  Description of Readiness to Change criteria: report stopped usage of alcohol and cocaine 40 days ago  Dimension 5:  Relapse, Continued use, or Continued Problem Potential:  Dimension 5:  Relapse, continued use, or continued problem potential critiera description: report stopped usage of alcohol and cocaine 40 days ago  Dimension 6:  Recovery/Living Environment:  Dimension 6:  Recovery/Iiving environment criteria description: continue to report urges to use, expressed has not used in 40 days. Report he's alone and does not feel this is a good ideal  ASAM Severity Score:    ASAM Recommended Level of Treatment:     Substance use Disorder (SUD) Substance Use Disorder (SUD)  Checklist Symptoms of Substance Use: Evidence of tolerance,  Presence of craving or strong urge to use, Substance(s) often taken in larger amounts or over longer times than was intended, Evidence of withdrawal (Comment)  Recommendations for Services/Supports/Treatments: Recommendations for Services/Supports/Treatments Recommendations For Services/Supports/Treatments: Individual Therapy, CD-IOP Intensive Chemical Dependency Program  Discharge Disposition:    DSM5 Diagnoses: There are no problems to display for this patient.    Referrals to Alternative  Service(s): Referred to Alternative Service(s):   Place:   Date:   Time:    Referred to Alternative Service(s):   Place:   Date:   Time:    Referred to Alternative Service(s):   Place:   Date:   Time:    Referred to Alternative Service(s):   Place:   Date:   Time:     Tsuruko Murtha, LCAS

## 2022-04-25 NOTE — Progress Notes (Signed)
   04/25/22 1431  BHUC Triage Screening (Walk-ins at [facility] only)  What Is the Reason for Your Visit/Call Today? 41 year old Christian Andrews present to [facility] voluntary by [employer] reporting depressive symptoms and suicidal ideations with a plan to jump off a building. Depressive symptoms feelings of hopelessness, worthlessness, an anxiety. Report depressive symptoms triggered by discontinue use of substances. Report he stopped abusing alcohol and cocaine and has been sober for 40 days. Report he think he missed up by detoxing on his own and alone. Currenlty denies withdrawal symptoms but continues to report urges. Denied homicidal ideations and denied auditory/visual hallucinations. Patient seen at [facility] on 04/22/2022 requesting OPT resources reporting depressive symptoms triggered unresolved grief.  How Long Has This Been Causing You Problems? 1 wk - 1 month  Have You Recently Had Any Thoughts About Hurting Yourself? No  Are You Planning to Commit Suicide/Harm Yourself At This time? No  Have you Recently Had Thoughts About Hurting Someone [name]? No  Are You Planning To Harm Someone At This Time? No  Are you currently experiencing any auditory, visual or other hallucinations? No  Have You Used Any Alcohol or Drugs in the Past 24 Hours? No  Do you have any current medical co-morbidities that require immediate attention? No  Clinician description of patient physical appearance/behavior: patient cooperative  What Do You Feel Would Help You the Most Today? Alcohol or Drug Use Treatment;Stress Management  If access to [facility] Urgent Care was not available, would you have sought care in the Emergency Department? No  Determination of Need Emergent (2 hours)  Options For Referral Inpatient Hospitalization

## 2022-04-25 NOTE — ED Provider Notes (Signed)
[facility] Continuous [facility] Ad[facility] H&P  Date: 04/25/22 Patient Name: Christian Andrews Rachal MRN: [id number] Chief Complaint:  Chief Complaint  Patient presents with   Depression    41 year old Christian Andrews Madara [facility] by [employer] reporting depressive symptoms and suicidal ideations with a plan to jump off a building.       Diagnoses:  Final diagnoses:  MDD (major depressive disorder), severe (HCC)    HPI: Christian Andrews Behrman 41 y.o., male patient [facility] as a walk in complaint of increased depression, SI with a plan to jump off a bridge.  Christian Andrews Patnaude, 41 y.o., male patient seen face to face by this provider, consulted with Dr. Bronwen BettersLaubach; and chart reviewed on 04/25/22.  Patient reports he is unsure if he has been diagnosed with any type of mental illness in the past.  However he has been psychiatrically hospitalized 3 times in the past.  His last admission was roughly 20 years ago.  He has attempted suicide at least 3 times in the past by means of slitting his wrist and overdosing.  He is currently not on any medications.  He has no outpatient psychiatric resources in place.  He denies using any substances.  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  He lives alone.  Patient reports 1 year ago he lost his mother and a few days later his brother committed suicide.  Since that time he has noticed that his depression and anxiety have increased.  After he quit drinking and using cocaine roughly 40 days ago that is when he noticed his symptoms intensified.  Over the past few days he has had intrusive suicidal thoughts.  Reports, "I do not want to hurt myself but right now I keep having these thoughts to kill myself and I cannot trust myself not to do anything".   During evaluation Christian Andrews Gueye is sitting in the assessment room in no acute distress.  He is alert/oriented x4 and cooperative.  He is fairly groomed and makes good eye contact.  He  has normal speech.  He endorses depression with feelings of helplessness, hopelessness, guilt, decreased motivation, decreased sleep and decreased appetite.  He has a congruent depressed affect.  He sleeps roughly 3-4 hours per night.  He has lost roughly 35 pounds over the past year.  Reports he has never gotten over losing his mother and brother and he is concerned that his landlord will make an vacate the home he is living in.  He endorses suicidal ideations with a plan to jump off of a bridge or overdose.  He cannot contract for safety.  He denies HI/AVH.  He does not appear to be responding to internal/external stimuli.  He denies delusional or paranoid thought content.   Discussed inpatient psychiatric admission with patient and he is in agreement.  [facility] notified.   PHQ 2-9:   Flowsheet Row [facility]  C-SSRS RISK CATEGORY No Risk No Risk        Total Time spent with patient: 30 minutes  Musculoskeletal  Strength & Muscle Tone: within normal limits Gait & Station: normal Patient leans: N/A  Psychiatric Specialty Exam  Presentation General Appearance: Casual  Eye Contact:Good  Speech:Clear and Coherent; Normal Rate  Speech Volume:Normal  Handedness:Right   Mood and Affect  Mood:Anxious; Depressed; Hopeless; Worthless  Affect:Congruent; Depressed   Thought  Process  Thought Processes:Coherent  Descriptions of Associations:Intact  Orientation:Full (Time, Place and Person)  Thought Content:Logical  Diagnosis of Schizophrenia or Schizoaffective disorder in past: No   Hallucinations:Hallucinations: None  Ideas of Reference:None  Suicidal Thoughts:Suicidal Thoughts: Yes, Active SI Active Intent and/or Plan: With Intent; With Plan; With Means to Carry Out  Homicidal Thoughts:Homicidal Thoughts: No   Sensorium  Memory:Immediate Good; Recent  Good; Remote Good  Judgment:Poor  Insight:Poor   Executive Functions  Concentration:Good  Attention Span:Good  Recall:Good  Fund of Knowledge:Good  Language:Good   Psychomotor Activity  Psychomotor Activity:Psychomotor Activity: Normal   Assets  Assets:Communication Skills; Desire for Improvement; Housing; Leisure Time; Physical Health; Resilience; Financial Resources/Insurance   Sleep  Sleep:Sleep: Poor Number of Hours of Sleep: 2   Nutritional Assessment (For OBS and FBC admissions only) Has the patient had a weight loss or gain of 10 pounds or more in the last 3 months?: Yes Has the patient had a decrease in food intake/or appetite?: Yes Does the patient have dental problems?: No Does the patient have eating habits or behaviors that may be indicators of an eating disorder including binging or inducing vomiting?: No Has the patient recently lost weight without trying?: 4 Has the patient been eating poorly because of a decreased appetite?: 1 Malnutrition Screening Tool Score: 5    Physical Exam Vitals and nursing note reviewed.  Constitutional:      General: He is not in acute distress.    Appearance: Normal appearance. He is well-developed.  HENT:     Head: Normocephalic and atraumatic.  Eyes:     General:        Right eye: No discharge.        Left eye: No discharge.     Conjunctiva/sclera: Conjunctivae normal.  Cardiovascular:     Rate and Rhythm: Normal rate.     Heart sounds: No murmur heard. Pulmonary:     Effort: Pulmonary effort is normal. No respiratory distress.  Musculoskeletal:        General: Normal range of motion.     Cervical back: Normal range of motion.  Skin:    Coloration: Skin is not jaundiced or pale.  Neurological:     Mental Status: He is alert and oriented to person, place, and time.  Psychiatric:        Mood and Affect: Mood is anxious and depressed.        Speech: Speech normal.        Behavior: Behavior normal.  Behavior is cooperative.        Thought Content: Thought content includes suicidal ideation. Thought content includes suicidal plan.        Cognition and Memory: Cognition normal.        Judgment: Judgment is impulsive.    Review of Systems  Constitutional: Negative.   HENT: Negative.    Eyes: Negative.   Respiratory: Negative.    Cardiovascular: Negative.   Musculoskeletal: Negative.   Skin: Negative.   Neurological: Negative.   Psychiatric/Behavioral:  Positive for depression and suicidal ideas. The patient is nervous/anxious.     Blood pressure 108/83, pulse 97, temperature 98.1 F (36.7 C), temperature source Oral, resp. rate 19, SpO2 100 %. There is no height or weight on file to calculate BMI.  Past Psychiatric History: Patient self reports depression  Is the patient at risk to self? Yes  Has the patient been a risk to self in the past 6 months? Yes .    Has the  patient been a risk to self within the distant past? Yes   Is the patient a risk to others? No   Has the patient been a risk to others in the past 6 months? No   Has the patient been a risk to others within the distant past? No   Past Medical History: No past medical history on file. No past surgical history on file.  Family History: No family history on file.  Social History:  Social History   Socioeconomic History   Marital status: Single    Spouse name: Not on file   Number of children: Not on file   Years of education: Not on file   Highest education level: Not on file  Occupational History   Not on file  Tobacco Use   Smoking status: Some Days    Packs/day: 1.00    Types: Cigarettes   Smokeless tobacco: Never  Substance and Sexual Activity   Alcohol use: Yes   Drug use: Never   Sexual activity: Not on file  Other Topics Concern   Not on file  Social History Narrative   Not on file   Social Determinants of Health   Financial Resource Strain: Not on file  Food Insecurity: Not on file   Transportation Needs: Not on file  Physical Activity: Not on file  Stress: Not on file  Social Connections: Not on file  Intimate Partner Violence: Not on file    SDOH:  SDOH Screenings   Alcohol Screen: Not on file  Depression (RFF6-3): Not on file  Financial Resource Strain: Not on file  Food Insecurity: Not on file  Housing: Not on file  Physical Activity: Not on file  Social Connections: Not on file  Stress: Not on file  Tobacco Use: High Risk (09/20/2021)   Patient History    Smoking Tobacco Use: Some Days    Smokeless Tobacco Use: Never    Passive Exposure: Not on file  Transportation Needs: Not on file    Last Labs:  No visits with results within 6 Month(s) from this visit.  Latest known visit with results is:  Admission on 10/05/2020, Discharged on 10/05/2020  Component Date Value Ref Range Status   Glucose-Capillary 10/05/2020 110 (H)  70 - 99 mg/dL Final   Glucose reference range applies only to samples taken after fasting for at least 8 hours.   WBC 10/05/2020 11.2 (H)  4.0 - 10.5 K/uL Final   RBC 10/05/2020 4.97  4.22 - 5.81 MIL/uL Final   Hemoglobin 10/05/2020 16.9  13.0 - 17.0 g/dL Final   HCT 84/66/5993 50.5  39.0 - 52.0 % Final   MCV 10/05/2020 101.6 (H)  80.0 - 100.0 fL Final   MCH 10/05/2020 34.0  26.0 - 34.0 pg Final   MCHC 10/05/2020 33.5  30.0 - 36.0 g/dL Final   RDW 57/10/7791 13.7  11.5 - 15.5 % Final   Platelets 10/05/2020 257  150 - 400 K/uL Final   nRBC 10/05/2020 0.0  0.0 - 0.2 % Final   Neutrophils Relative % 10/05/2020 84  % Final   Neutro Abs 10/05/2020 9.4 (H)  1.7 - 7.7 K/uL Final   Lymphocytes Relative 10/05/2020 9  % Final   Lymphs Abs 10/05/2020 1.1  0.7 - 4.0 K/uL Final   Monocytes Relative 10/05/2020 5  % Final   Monocytes Absolute 10/05/2020 0.6  0.1 - 1.0 K/uL Final   Eosinophils Relative 10/05/2020 1  % Final   Eosinophils Absolute 10/05/2020 0.1  0.0 - 0.5 K/uL Final   Basophils Relative 10/05/2020 1  % Final   Basophils  Absolute 10/05/2020 0.1  0.0 - 0.1 K/uL Final   Immature Granulocytes 10/05/2020 0  % Final   Abs Immature Granulocytes 10/05/2020 0.04  0.00 - 0.07 K/uL Final   Performed at [facility], [address] [place], [place] [address]   Sodium 10/05/2020 135  135 - 145 mmol/L Final   Potassium 10/05/2020 3.7  3.5 - 5.1 mmol/L Final   Chloride 10/05/2020 99  98 - 111 mmol/L Final   CO2 10/05/2020 23  22 - 32 mmol/L Final   Glucose, Bld 10/05/2020 98  70 - 99 mg/dL Final   Glucose reference range applies only to samples taken after fasting for at least 8 hours.   BUN 10/05/2020 14  6 - 20 mg/dL Final   Creatinine, Ser 10/05/2020 0.84  0.61 - 1.24 mg/dL Final   Calcium 16/38/4536 9.0  8.9 - 10.3 mg/dL Final   Total Protein 46/80/3212 7.5  6.5 - 8.1 g/dL Final   Albumin 24/82/5003 4.4  3.5 - 5.0 g/dL Final   AST 70/48/8891 31  15 - 41 U/L Final   ALT 10/05/2020 24  0 - 44 U/L Final   Alkaline Phosphatase 10/05/2020 48  38 - 126 U/L Final   Total Bilirubin 10/05/2020 1.3 (H)  0.3 - 1.2 mg/dL Final   GFR, Estimated 10/05/2020 >60  >60 mL/min Final   Comment: (NOTE) Calculated using the CKD-EPI Creatinine Equation (2021)    Anion gap 10/05/2020 13  5 - 15 Final   Performed at [facility], [address], [place], [place] [address]   Alcohol, Ethyl (B) 10/05/2020 <10  <10 mg/dL Final   Comment: (NOTE) Lowest detectable limit for serum alcohol is 10 mg/dL.  For medical purposes only. Performed at [facility], [address] [place], [place] [address]     Allergies: Patient has no known allergies.  PTA Medications: (Not in a hospital admission)   Medical Decision Making  Patient presents to [facility] with increased depression and suicidal ideations with a plan to jump off of a bridge or to overdose.  He cannot contract for safety.  He meets the criteria for inpatient psychiatric admission.  [facility] notified and there is no bed  availability.  Patient will be admitted to the continuous assessment unit while awaiting inpatient psychiatric bed availability.    Recommendations  Based on my evaluation the patient does not appear to have an emergency medical condition.  Patient meets criteria for inpatient psychiatric admission.  Lab Orders         Resp Panel by RT-PCR (Flu A&B, Covid) Anterior Nasal Swab         CBC with Differential/Platelet         Comprehensive metabolic panel         Hemoglobin A1c         Magnesium         Ethanol         Lipid panel         TSH         RPR         Urinalysis, Complete w Microscopic Urine, Clean Catch         POCT Urine Drug Screen - (I-Screen)         POC SARS Coronavirus 2 Ag-ED - Nasal Swab  POC SARS Coronavirus 2 Ag      EKG ordered   Medications- Lexapro 10 mg QD for depression.    Ardis Hughs, NP 04/25/22  4:09 PM

## 2022-04-26 ENCOUNTER — Encounter (HOSPITAL_COMMUNITY): Payer: Self-pay | Admitting: Psychiatry

## 2022-04-26 ENCOUNTER — Inpatient Hospital Stay (HOSPITAL_COMMUNITY)
Admission: AD | Admit: 2022-04-26 | Discharge: 2022-04-30 | DRG: 885 | Disposition: A | Discharge status: Home / Self Care | Payer: Federal, State, Local not specified - Other | Attending: Psychiatry | Admitting: Psychiatry

## 2022-04-26 DIAGNOSIS — F1590 Other stimulant use, unspecified, uncomplicated: Secondary | ICD-10-CM

## 2022-04-26 DIAGNOSIS — F329 Major depressive disorder, single episode, unspecified: Secondary | ICD-10-CM | POA: Diagnosis present

## 2022-04-26 DIAGNOSIS — Z818 Family history of other mental and behavioral disorders: Secondary | ICD-10-CM | POA: Diagnosis not present

## 2022-04-26 DIAGNOSIS — F411 Generalized anxiety disorder: Secondary | ICD-10-CM | POA: Diagnosis present

## 2022-04-26 DIAGNOSIS — R45851 Suicidal ideations: Secondary | ICD-10-CM | POA: Diagnosis present

## 2022-04-26 DIAGNOSIS — F1521 Other stimulant dependence, in remission: Secondary | ICD-10-CM | POA: Diagnosis present

## 2022-04-26 DIAGNOSIS — F1021 Alcohol dependence, in remission: Secondary | ICD-10-CM | POA: Diagnosis present

## 2022-04-26 DIAGNOSIS — Z9151 Personal history of suicidal behavior: Secondary | ICD-10-CM | POA: Diagnosis not present

## 2022-04-26 DIAGNOSIS — F332 Major depressive disorder, recurrent severe without psychotic features: Secondary | ICD-10-CM | POA: Diagnosis present

## 2022-04-26 DIAGNOSIS — F17210 Nicotine dependence, cigarettes, uncomplicated: Secondary | ICD-10-CM | POA: Diagnosis present

## 2022-04-26 LAB — RPR: RPR Ser Ql: NONREACTIVE

## 2022-04-26 MED ORDER — ACETAMINOPHEN 325 MG PO TABS: 650.0000 mg | ORAL_TABLET | Freq: Four times a day (QID) | ORAL | Status: DC | PRN

## 2022-04-26 MED ORDER — TRAZODONE HCL 50 MG PO TABS
50.0000 mg | ORAL_TABLET | Freq: Every evening | ORAL | Status: DC | PRN
  Administered 2022-04-26 – 2022-04-27 (×2): 50 mg via ORAL
  Filled 2022-04-26 (×2): qty 1
  Filled 2022-04-26: qty 7

## 2022-04-26 MED ORDER — MAGNESIUM HYDROXIDE 400 MG/5ML PO SUSP: 30.0000 mL | Freq: Every day | ORAL | Status: DC | PRN

## 2022-04-26 MED ORDER — ESCITALOPRAM OXALATE 10 MG PO TABS
10.0000 mg | ORAL_TABLET | Freq: Every day | ORAL | Status: DC
  Filled 2022-04-26 (×2): qty 1

## 2022-04-26 MED ORDER — ALUM & MAG HYDROXIDE-SIMETH 200-200-20 MG/5ML PO SUSP: 30.0000 mL | ORAL | Status: DC | PRN

## 2022-04-26 MED ORDER — VENLAFAXINE HCL ER 75 MG PO CP24
75.0000 mg | ORAL_CAPSULE | Freq: Every day | ORAL | Status: DC
  Administered 2022-04-27 – 2022-04-28 (×2): 75 mg via ORAL
  Filled 2022-04-26 (×4): qty 1
  Filled 2022-04-26: qty 7
  Filled 2022-04-26: qty 1

## 2022-04-26 MED ORDER — ENSURE ENLIVE PO LIQD
237.0000 mL | Freq: Two times a day (BID) | ORAL | Status: DC
Start: 2022-04-26 — End: 2022-04-30
  Administered 2022-04-26 – 2022-04-30 (×9): 237 mL via ORAL
  Filled 2022-04-26 (×12): qty 237

## 2022-04-26 MED ORDER — HYDROXYZINE HCL 25 MG PO TABS
25.0000 mg | ORAL_TABLET | Freq: Three times a day (TID) | ORAL | Status: DC | PRN
  Administered 2022-04-26 – 2022-04-30 (×6): 25 mg via ORAL
  Filled 2022-04-26: qty 1
  Filled 2022-04-26: qty 10
  Filled 2022-04-26 (×4): qty 1
  Filled 2022-04-26: qty 7
  Filled 2022-04-26: qty 1

## 2022-04-26 MED ORDER — NICOTINE 21 MG/24HR TD PT24
21.0000 mg | MEDICATED_PATCH | Freq: Every day | TRANSDERMAL | Status: DC
  Administered 2022-04-27 – 2022-04-30 (×4): 21 mg via TRANSDERMAL
  Filled 2022-04-26 (×6): qty 1

## 2022-04-26 NOTE — ED Notes (Signed)
Pt is awake and alert x4. He has flat affect but good eye contact and brightens upon engagement. Pt reports that he has not had any drugs or alcohol for > 40 days. States that he has had thoughts of self harm including jumping from a building.  Pt currently is vague about his suicidal thoughts.  Reports that he will not harm self while at [facility].  Pt given morning medications and food.

## 2022-04-26 NOTE — ED Notes (Signed)
Pt sleeping at present, no distress noted.  Monitoring for safety. 

## 2022-04-26 NOTE — ED Notes (Signed)
Safe transport here a this time.  Pt given back all his belongings and transferred without incident.

## 2022-04-26 NOTE — Progress Notes (Signed)
Patient rated his day as a 6 out of 10 since he made a "big transition" today by being transferred to this hospital. His positive event for the day is that he met some new people.  

## 2022-04-26 NOTE — H&P (Signed)
Psychiatric Admission Assessment Adult  Patient Identification: Christian Andrews MRN:  [id number] Date of Evaluation:  04/26/2022 Chief Complaint:  MDD (major depressive disorder) [F32.9] Principal Diagnosis: Major depressive disorder, recurrent episode, severe (HCC) Diagnosis:  Principal Problem:   Major depressive disorder, recurrent episode, severe (HCC) Active Problems:   Generalized anxiety disorder   Alcohol use disorder, severe, in early remission (HCC)   Stimulant use disorder   CC: "I can't keep going right now"  Christian Andrews is a 41 year old male with a past psychiatric history of depression.  He presented voluntarily to the [facility] behavioral health urgent care on 7/9 for suicidal thoughts with a plan to jump off a bridge.  He reports multiple stressors including the death of his mother and subsequent suicide of his brother as well as difficulty paying bills recently.  He currently meets criteria for major depressive disorder and generalized anxiety disorder, as well as alcohol use disorder and stimulant use disorder, both in remission.  He is voluntarily admitted to the [facility] behavioral health hospital.   Mode of transport to Hospital: personal owned vehicle Current Outpatient (Home) Medication List: none PRN medication prior to evaluation: none  ED course: uneventful, no PRN medication POA/Legal Guardian: None  HPI:  The patient last reports experiencing suicidal thoughts on Monday, when he reported to the behavioral health urgent care with suicidal thoughts with a plan to jump off a bridge.  He states 2 main stressors are contributing to his current depression and suicidal thoughts.  The first is the death of his mother 1.5 years ago.  After his mother died, he reports that his brother, who is a heroin addict, killed himself.  The patient reports a very close relationship with his mother.  The other main stressor the patient reports is the fact that the patient took a leave  of absence from his job for approximately 50 days and finally quit his job on Friday which has resulted in bills piling up that he cannot pay.  He reports that he decided to leave his job because he works in [employer] where cocaine and alcohol use are omnipresent.  He states both the substances have been problems for him previously.  He reports that he has been sober from both of them for the entirety of his leave of absence.   The patient reports last experiencing suicidal thoughts on Monday.  He denies present suicidal ideation.  Patient denies any recent homicidal thoughts.  The patient reports that he has "always been depressed".  For the past 1 year he states that his depression has been particularly bad.  He states that he has no volition and states that he lost a significant amount of weight.  He reports pan positive depressive symptoms, save for appetite, which the patient says is still intact.  He even endorses psychomotor slowing.  Psychiatric review of symptoms is notable for prominent anxiety symptoms.  Patient meets criteria for generalized anxiety disorder reports racing thoughts about finances and about others' opinion of himself.  Bipolar affective disorder screening is negative.  PTSD screening is negative.  Patient has known exposure to previous traumatic events.  Patient denies previous psychotic symptoms.  Borderline personality disorder screening is negative.  Past Psychiatric Hx: The patient reports a past history of depression and states that at one time he was diagnosed with borderline personality disorder but he disagrees with this diagnosis.  He reports a brief psychiatric hospitalization in [facility].  He believes it was a  state hospital stay.  He reports that he was only there for 2 days when he was 41 years old.  He reports a past history of 3 overdose attempts in his 20s/early teens and 1 episode of wrist cutting.  He denies psychiatric  hospitalizations within the last 20 years.  He reports taking no regularly prescribed psychiatric medications except for an antidepressant that starts with a "T".  The patient believes that it is trazodone after the provider names this medication.  Substance Abuse Hx: Patient reports frequent and intense use of alcohol and cocaine for most of his life.  He reports being sober from these substances for the past 40 days.  He denies the use of other illegal drugs.  He reports that he smokes cigarettes.  UDS is negative.  Past Medical History: He denies any previous history of medical diagnoses states that he is never had a seizure before.  Family History: Completed suicide in his brother, who also suffered from heroin addiction and bipolar affective disorder.  The patient also reports that his mother had bipolar affective disorder.    Social History: The patient is currently renting a home in [place].  His finances are limited presently because he has not been working.  He reports growing up in [place] until he is 41 years old at which time he moved to [place] to take care of his mother.  Presently he reports having 2 family members but in the area.  His biological father is in [place] and the patient refers to him as "more of a friend".  He also reports that his 71 year old grandmother lives in [place].    Is the patient at risk to self? Yes.    Has the patient been a risk to self in the past 6 months? Yes.    Has the patient been a risk to self within the distant past? Yes.    Is the patient a risk to others? No.  Has the patient been a risk to others in the past 6 months? No.  Has the patient been a risk to others within the distant past? No.   Prior Inpatient Therapy:   Prior Outpatient Therapy:    Alcohol Screening: 1. How often do you have a drink containing alcohol?: Never 2. How many drinks containing alcohol do you have on a typical day when you are drinking?: 1  or 2 3. How often do you have six or more drinks on one occasion?: Never AUDIT-C Score: 0 4. How often during the last year have you found that you were not able to stop drinking once you had started?: Never 5. How often during the last year have you failed to do what was normally expected from you because of drinking?: Never 6. How often during the last year have you needed a first drink in the morning to get yourself going after a heavy drinking session?: Never 7. How often during the last year have you had a feeling of guilt of remorse after drinking?: Never 8. How often during the last year have you been unable to remember what happened the night before because you had been drinking?: Never 9. Have you or someone else been injured as a result of your drinking?: No 10. Has a relative or friend or a doctor or another health worker been concerned about your drinking or suggested you cut down?: No Alcohol Use Disorder Identification Test Final Score (AUDIT): 0 Substance Abuse History in the last 12 months:  Yes.  Consequences of Substance Abuse: Negative Previous Psychotropic Medications: Yes  Psychological Evaluations: Yes  Past Medical History: History reviewed. No pertinent past medical history. History reviewed. No pertinent surgical history. Family History: History reviewed. No pertinent family history. Family Psychiatric  History: as above Tobacco Screening:   Social History:  Social History   Substance and Sexual Activity  Alcohol Use Not Currently     Social History   Substance and Sexual Activity  Drug Use Not Currently   Types: Cocaine    Additional Social History:                           Allergies:  No Known Allergies Lab Results:  Results for orders placed or performed during the hospital encounter of 04/25/22 (from the past 48 hour(s))  Resp Panel by RT-PCR (Flu A&B, Covid) Anterior Nasal Swab     Status: None   Collection Time: 04/25/22  5:01 PM    Specimen: Anterior Nasal Swab  Result Value Ref Range   SARS Coronavirus 2 by RT PCR NEGATIVE NEGATIVE    Comment: (NOTE) SARS-CoV-2 target nucleic acids are NOT DETECTED.  The SARS-CoV-2 RNA is generally detectable in upper respiratory specimens during the acute phase of infection. The lowest concentration of SARS-CoV-2 viral copies this assay can detect is 138 copies/mL. A negative result does not preclude SARS-Cov-2 infection and should not be used as the sole basis for treatment or other patient management decisions. A negative result may occur with  improper specimen collection/handling, submission of specimen other than nasopharyngeal swab, presence of viral mutation(s) within the areas targeted by this assay, and inadequate number of viral copies(<138 copies/mL). A negative result must be combined with clinical observations, patient history, and epidemiological information. The expected result is Negative.  Fact Sheet for Patients:  BloggerCourse.com  Fact Sheet for Healthcare Providers:  SeriousBroker.it  This test is no t yet approved or cleared by the [place] FDA and  has been authorized for detection and/or diagnosis of SARS-CoV-2 by FDA under an Emergency Use Authorization (EUA). This EUA will remain  in effect (meaning this test can be used) for the duration of the COVID-19 declaration under Section 564(b)(1) of the Act, 21 U.S.C.section 360bbb-3(b)(1), unless the authorization is terminated  or revoked sooner.       Influenza A by PCR NEGATIVE NEGATIVE   Influenza B by PCR NEGATIVE NEGATIVE    Comment: (NOTE) The Xpert Xpress SARS-CoV-2/FLU/RSV plus assay is intended as an aid in the diagnosis of influenza from Nasopharyngeal swab specimens and should not be used as a sole basis for treatment. Nasal washings and aspirates are unacceptable for Xpert Xpress SARS-CoV-2/FLU/RSV testing.  Fact Sheet for  Patients: BloggerCourse.com  Fact Sheet for Healthcare Providers: SeriousBroker.it  This test is not yet approved or cleared by the [place] FDA and has been authorized for detection and/or diagnosis of SARS-CoV-2 by FDA under an Emergency Use Authorization (EUA). This EUA will remain in effect (meaning this test can be used) for the duration of the COVID-19 declaration under Section 564(b)(1) of the Act, 21 U.S.C. section 360bbb-3(b)(1), unless the authorization is terminated or revoked.  Performed at [facility], [address] [place], [place] [address]   CBC with Differential/Platelet     Status: Abnormal   Collection Time: 04/25/22  5:02 PM  Result Value Ref Range   WBC 6.6 4.0 - 10.5 K/uL   RBC 3.97 (L) 4.22 - 5.81  MIL/uL   Hemoglobin 12.7 (L) 13.0 - 17.0 g/dL   HCT 96.2 (L) 95.2 - 84.1 %   MCV 97.0 80.0 - 100.0 fL   MCH 32.0 26.0 - 34.0 pg   MCHC 33.0 30.0 - 36.0 g/dL   RDW 32.4 40.1 - 02.7 %   Platelets 236 150 - 400 K/uL   nRBC 0.0 0.0 - 0.2 %   Neutrophils Relative % 66 %   Neutro Abs 4.4 1.7 - 7.7 K/uL   Lymphocytes Relative 22 %   Lymphs Abs 1.4 0.7 - 4.0 K/uL   Monocytes Relative 6 %   Monocytes Absolute 0.4 0.1 - 1.0 K/uL   Eosinophils Relative 5 %   Eosinophils Absolute 0.3 0.0 - 0.5 K/uL   Basophils Relative 1 %   Basophils Absolute 0.1 0.0 - 0.1 K/uL   Immature Granulocytes 0 %   Abs Immature Granulocytes 0.02 0.00 - 0.07 K/uL    Comment: Performed at [facility], [address], [place], [place] [address]  Comprehensive metabolic panel     Status: Abnormal   Collection Time: 04/25/22  5:02 PM  Result Value Ref Range   Sodium 138 135 - 145 mmol/L   Potassium 3.7 3.5 - 5.1 mmol/L   Chloride 108 98 - 111 mmol/L   CO2 23 22 - 32 mmol/L   Glucose, Bld 93 70 - 99 mg/dL    Comment: Glucose reference range applies only to samples taken after fasting for at least 8 hours.   BUN 8 6 - 20  mg/dL   Creatinine, Ser 4.40 0.61 - 1.24 mg/dL   Calcium 9.0 8.9 - 34.7 mg/dL   Total Protein 6.2 (L) 6.5 - 8.1 g/dL   Albumin 3.8 3.5 - 5.0 g/dL   AST 21 15 - 41 U/L   ALT 18 0 - 44 U/L   Alkaline Phosphatase 38 38 - 126 U/L   Total Bilirubin 0.6 0.3 - 1.2 mg/dL   GFR, Estimated >42 >59 mL/min    Comment: (NOTE) Calculated using the CKD-EPI Creatinine Equation (2021)    Anion gap 7 5 - 15    Comment: Performed at [facility], [address] [place], [place] [address]  Hemoglobin A1c     Status: Abnormal   Collection Time: 04/25/22  5:02 PM  Result Value Ref Range   Hgb A1c MFr Bld 5.7 (H) 4.8 - 5.6 %    Comment: (NOTE) Pre diabetes:          5.7%-6.4%  Diabetes:              >6.4%  Glycemic control for   <7.0% adults with diabetes    Mean Plasma Glucose 116.89 mg/dL    Comment: Performed at [facility], [address], [place], [place] [address]  Magnesium     Status: None   Collection Time: 04/25/22  5:02 PM  Result Value Ref Range   Magnesium 2.0 1.7 - 2.4 mg/dL    Comment: Performed at [facility], [address], [place], [place] [address]  Ethanol     Status: None   Collection Time: 04/25/22  5:02 PM  Result Value Ref Range   Alcohol, Ethyl (B) <10 <10 mg/dL    Comment: (NOTE) Lowest detectable limit for serum alcohol is 10 mg/dL.  For medical purposes only. Performed at [facility], [address], [place], [place] [address]   Lipid panel     Status:  Abnormal   Collection Time: 04/25/22  5:02 PM  Result Value Ref Range   Cholesterol 183 0 - 200 mg/dL   Triglycerides 96 <638 mg/dL   HDL 55 >46 mg/dL   Total CHOL/HDL Ratio 3.3 RATIO   VLDL 19 0 - 40 mg/dL   LDL Cholesterol 659 (H) 0 - 99 mg/dL    Comment:        Total Cholesterol/HDL:CHD Risk Coronary Heart Disease Risk Table                     Men   Women  1/2 Average Risk   3.4   3.3  Average Risk       5.0   4.4  2 X Average Risk   9.6   7.1  3 X Average Risk  23.4    11.0        Use the calculated Patient Ratio above and the CHD Risk Table to determine the patient's CHD Risk.        ATP III CLASSIFICATION (LDL):  <100     mg/dL   Optimal  935-701  mg/dL   Near or Above                    Optimal  130-159  mg/dL   Borderline  779-390  mg/dL   High  >300     mg/dL   Very High Performed at [facility], [address], [place], [place] [address]   TSH     Status: None   Collection Time: 04/25/22  5:02 PM  Result Value Ref Range   TSH 1.527 0.350 - 4.500 uIU/mL    Comment: Performed by a 3rd Generation assay with a functional sensitivity of <=0.01 uIU/mL. Performed at [facility], [address] [place], [place] [address]   RPR     Status: None   Collection Time: 04/25/22  5:02 PM  Result Value Ref Range   RPR Ser Ql NON REACTIVE NON REACTIVE    Comment: Performed at [facility], [address] [place], [place] [address]  Urinalysis, Complete w Microscopic PATH Cytology Urine     Status: Abnormal   Collection Time: 04/25/22  5:02 PM  Result Value Ref Range   Color, Urine COLORLESS (A) YELLOW   APPearance CLEAR CLEAR   Specific Gravity, Urine 1.002 (L) 1.005 - 1.030   pH 5.0 5.0 - 8.0   Glucose, UA NEGATIVE NEGATIVE mg/dL   Hgb urine dipstick NEGATIVE NEGATIVE   Bilirubin Urine NEGATIVE NEGATIVE   Ketones, ur NEGATIVE NEGATIVE mg/dL   Protein, ur NEGATIVE NEGATIVE mg/dL   Nitrite NEGATIVE NEGATIVE   Leukocytes,Ua NEGATIVE NEGATIVE   Bacteria, UA NONE SEEN NONE SEEN    Comment: Performed at [facility], [address] [place], [place] [address]  POCT Urine Drug Screen - (I-Screen)     Status: Normal   Collection Time: 04/25/22  5:02 PM  Result Value Ref Range   POC Amphetamine UR None Detected NONE DETECTED (Cut Off Level 1000 ng/mL)   POC Secobarbital (BAR) None Detected NONE DETECTED (Cut Off Level 300 ng/mL)   POC Buprenorphine (BUP) None Detected NONE DETECTED (Cut Off Level 10 ng/mL)   POC Oxazepam  (BZO) None Detected NONE DETECTED (Cut Off Level 300 ng/mL)   POC Cocaine UR None Detected NONE DETECTED (Cut Off Level 300 ng/mL)   POC Methamphetamine UR None Detected NONE DETECTED (Cut Off  Level 1000 ng/mL)   POC Morphine None Detected NONE DETECTED (Cut Off Level 300 ng/mL)   POC Methadone UR None Detected NONE DETECTED (Cut Off Level 300 ng/mL)   POC Oxycodone UR None Detected NONE DETECTED (Cut Off Level 100 ng/mL)   POC Marijuana UR None Detected NONE DETECTED (Cut Off Level 50 ng/mL)  POC SARS Coronavirus 2 Ag-ED - Nasal Swab     Status: Normal   Collection Time: 04/25/22  5:02 PM  Result Value Ref Range   SARS Coronavirus 2 Ag Negative Negative  POC SARS Coronavirus 2 Ag     Status: None   Collection Time: 04/25/22  5:10 PM  Result Value Ref Range   SARSCOV2ONAVIRUS 2 AG NEGATIVE NEGATIVE    Comment: (NOTE) SARS-CoV-2 antigen NOT DETECTED.   Negative results are presumptive.  Negative results do not preclude SARS-CoV-2 infection and should not be used as the sole basis for treatment or other patient management decisions, including infection  control decisions, particularly in the presence of clinical signs and  symptoms consistent with COVID-19, or in those who have been in contact with the virus.  Negative results must be combined with clinical observations, patient history, and epidemiological information. The expected result is Negative.  Fact Sheet for Patients: https://www.jennings-kim.com/  Fact Sheet for Healthcare Providers: https://alexander-rogers.biz/  This test is not yet approved or cleared by the [place] FDA and  has been authorized for detection and/or diagnosis of SARS-CoV-2 by FDA under an Emergency Use Authorization (EUA).  This EUA will remain in effect (meaning this test can be used) for the duration of  the COV ID-19 declaration under Section 564(b)(1) of the Act, 21 U.S.C. section 360bbb-3(b)(1), unless the  authorization is terminated or revoked sooner.      Blood Alcohol level:  Lab Results  Component Value Date   ETH <10 04/25/2022   ETH <10 10/05/2020    Metabolic Disorder Labs:  Lab Results  Component Value Date   HGBA1C 5.7 (H) 04/25/2022   MPG 116.89 04/25/2022   No results found for: "PROLACTIN" Lab Results  Component Value Date   CHOL 183 04/25/2022   TRIG 96 04/25/2022   HDL 55 04/25/2022   CHOLHDL 3.3 04/25/2022   VLDL 19 04/25/2022   LDLCALC 109 (H) 04/25/2022    Current Medications: Current Facility-Administered Medications  Medication Dose Route Frequency Provider Last Rate Last Admin   acetaminophen (TYLENOL) tablet 650 mg  650 mg Oral Q6H PRN Lauree Chandler, NP       alum & mag hydroxide-simeth (MAALOX/MYLANTA) 200-200-20 MG/5ML suspension 30 mL  30 mL Oral Q4H PRN Lauree Chandler, NP       feeding supplement (ENSURE ENLIVE / ENSURE PLUS) liquid 237 mL  237 mL Oral BID BM Massengill, Harrold Donath, MD   237 mL at 04/26/22 1501   hydrOXYzine (ATARAX) tablet 25 mg  25 mg Oral TID PRN Lauree Chandler, NP       magnesium hydroxide (MILK OF MAGNESIA) suspension 30 mL  30 mL Oral Daily PRN Lauree Chandler, NP       nicotine (NICODERM CQ - dosed in mg/24 hours) patch 21 mg  21 mg Transdermal Daily Massengill, Nathan, MD       traZODone (DESYREL) tablet 50 mg  50 mg Oral QHS PRN Lauree Chandler, NP       Melene Muller ON 04/27/2022] venlafaxine XR (EFFEXOR-XR) 24 hr capsule 75 mg  75 mg Oral Q breakfast Phineas Inches, MD  PTA Medications: Medications Prior to Admission  Medication Sig Dispense Refill Last Dose   Hydrocortisone (CORTIZONE-10 EX) Apply 1 Application topically daily as needed (For rash).       Musculoskeletal: Strength & Muscle Tone: within normal limits Gait & Station: normal Patient leans: N/A            Psychiatric Specialty Exam:  Presentation  General Appearance: Casual  Eye Contact:Good  Speech:Clear and  Coherent; Normal Rate  Speech Volume:Normal  Handedness:Right   Mood and Affect  Mood:Anxious; Depressed; Hopeless  Affect:Congruent   Thought Process  Thought Processes:Coherent  Duration of Psychotic Symptoms: No data recorded Past Diagnosis of Schizophrenia or Psychoactive disorder: No  Descriptions of Associations:Intact  Orientation:Full (Time, Place and Person)  Thought Content:Logical  Hallucinations:Hallucinations: None  Ideas of Reference:None  Suicidal Thoughts: denies Homicidal Thoughts:Homicidal Thoughts: No   Sensorium  Memory:Immediate Good; Recent Good  Judgment:Poor  Insight:Shallow   Executive Functions  Concentration:Good  Attention Span:Good  Recall:Good  Fund of Knowledge:Good  Language:Good   Psychomotor Activity  Psychomotor Activity:Psychomotor Activity: Normal   Assets  Assets:Communication Skills; Desire for Improvement   Sleep  Sleep: poor   Physical Exam Constitutional:      Appearance: the patient is not toxic-appearing.  Pulmonary:     Effort: Pulmonary effort is normal.  Neurological:     General: No focal deficit present.     Mental Status: the patient is alert and oriented to person, place, and time.   Review of Systems  Respiratory:  Negative for shortness of breath.   Cardiovascular:  Negative for chest pain.  Gastrointestinal:  Negative for abdominal pain, constipation, diarrhea, nausea and vomiting.  Neurological:  Negative for headaches.   Blood pressure 112/80, pulse (!) 115, temperature 98 F (36.7 C), temperature source Oral, resp. rate 18, height 6\' 2"  (1.88 m), weight 67.9 kg, SpO2 100 %. Body mass index is 19.23 kg/m.  Treatment Plan Summary: Daily contact with patient to assess and evaluate symptoms and progress in treatment and Medication management  Physician Treatment Plan for Primary Diagnosis: Major depressive disorder, recurrent episode, severe (HCC) Long Term Goal(s): Improvement  in symptoms so as ready for discharge  Short Term Goals: Ability to identify changes in lifestyle to reduce recurrence of condition will improve  Physician Treatment Plan for Secondary Diagnosis: Principal Problem:   Major depressive disorder, recurrent episode, severe (HCC) Active Problems:   Generalized anxiety disorder   Alcohol use disorder, severe, in early remission (HCC)   Stimulant use disorder  Long Term Goal(s): Improvement in symptoms so as ready for discharge  Short Term Goals: Ability to maintain clinical measurements within normal limits will improve  ASSESSMENT:  Diagnoses / Active Problems: Major depressive disorder, recurrent, severe Generalized anxiety disorder Alcohol use disorder, severe, in early remission Stimulant use disorder, severe, in early remission  PLAN: Safety and Monitoring:  --  Voluntary admission to inpatient psychiatric unit for safety, stabilization and treatment  -- Daily contact with patient to assess and evaluate symptoms and progress in treatment  -- Patient's case to be discussed in multi-disciplinary team meeting  -- Observation Level : q15 minute checks  -- Vital signs:  q12 hours  -- Precautions: suicide, elopement, and assault  2. Psychiatric Diagnoses and Treatment:  Major depressive disorder, recurrent, severe Generalized anxiety disorder --Start Effexor 75 mg daily for mood and anxiety -- The risks/benefits/side-effects/alternatives to this medication were discussed in detail with the patient and time was given for questions. The patient consents to  medication trial.   -- Encouraged patient to participate in unit milieu and in scheduled group therapies   -- Short Term Goals: Ability to identify changes in lifestyle to reduce recurrence of condition will improve  -- Long Term Goals: Improvement in symptoms so as ready for discharge    3. Medical Issues Being Addressed:   Tobacco Use Disorder  -- Nicotine patch 21mg /24 hours  ordered  -- Smoking cessation encouraged  No need for CIWA or COWS given patient's sobriety, however, will continue to observe   4. Discharge Planning:   -- Social work and case management to assist with discharge planning and identification of hospital follow-up needs prior to discharge  -- Estimated LOS: 5-7 days  -- Discharge Concerns: Need to establish a safety plan; Medication compliance and effectiveness  -- Discharge Goals: Return home with outpatient referrals for mental health follow-up including medication management/psychotherapy  --Patient has declined residential rehab  I certify that inpatient services furnished can reasonably be expected to improve the patient's condition.    Carlyn ReichertNick Kiven Vangilder, MD 7/12/20235:35 PM

## 2022-04-26 NOTE — Progress Notes (Signed)
   04/26/22 2200  Psych Admission Type (Psych Patients Only)  Admission Status Voluntary  Psychosocial Assessment  Patient Complaints Anxiety  Eye Contact Fair  Facial Expression Sad  Affect Anxious;Depressed  Speech Logical/coherent  Interaction Assertive  Motor Activity Slow  Appearance/Hygiene Unremarkable  Behavior Characteristics Cooperative  Mood Depressed  Thought Process  Coherency Circumstantial  Content Blaming self  Delusions None reported or observed  Perception WDL  Hallucination None reported or observed  Judgment Impaired  Confusion None  Danger to Self  Current suicidal ideation? Denies  Danger to Others  Danger to Others None reported or observed

## 2022-04-26 NOTE — Progress Notes (Signed)
Pt was accept to [facility] 04/26/22; 403-1  Pt meets inpatient criteria per Vernard Gambles, NP  Attending Physician will be Dr. Phineas Inches  Report can be called to: - Child and Adolescence unit: [phone] -Adult unit: [phone]  Pt can arrive after 0800  Care Team notified: Malva Limes, RN, Rona Ravens, RN, Loura Back, Lauree Chandler, NP, Claudia Desanctis, RN, Ireti Omoseebi,RN, Vernard Gambles, RN, Fransico Michael, RN., Kiara Riddick-Minor, Joslyn Devon, RN, Hansel Starling, RN, Rush Farmer.    [place], LCSWA 04/26/2022 @ 8:22 AM   Hi. this patient is accepted to 403-1 by carolyn c. massengill attending. dx mdd severe. please pre admit this patient. PATIENT TO ARRIVE AFTER 0800 TOMORROW. THANKS

## 2022-04-26 NOTE — ED Provider Notes (Signed)
FBC/OBS ASAP Discharge Summary  Date and Time: 04/26/2022 10:17 AM  Name: Christian Andrews  MRN:  [id number]   Discharge Diagnoses:  Final diagnoses:  MDD (major depressive disorder), severe ([place])   Subjective:   Pt reassessed by nurse practitioner today.  Pt w/ reported hx of MDD.  Pt reports he presented to this facility yesterday due to "lot of things building up". Reports worsening feelings of depression, anxiety, hopelessness. States he was experiencing active SI w/ plan to jump off a bridge yesterday. When asked if he had intent to act on plan, states "probably". Pt feels that worsening mood sx began when he decided to stop using alcohol and crack/cocaine. Pt denies any substance use in the past 40 days. Pt denies current SI/VI/HI. He is unable to verbally contract to safety for himself. He reports hx of multiple SAs, last occurring 20 years ago. Pt denies AVH, paranoia. Pt reports he is living alone. He denies access to a firearm. Pt reports family psychiatric hx is positive for bipolar disorder (mother, brother).  Pt has been accepted to [facility] for inpatient admission.  Stay Summary:  Pt is a 41 y/o male w/ reported hx of MDD presenting to [facility] on 04/26/11. Pt recommended for inpatient admission, has been accepted to [facility].  HPI from 04/25/22: Christian Andrews 41 y.o., male patient presented to [facility] as a walk in complaint of increased depression, SI with a plan to jump off a bridge.   Christian Andrews, 41 y.o., male patient seen face to face by this provider, consulted with Dr. Serafina Mitchell; and chart reviewed on 04/25/22.  Patient reports he is unsure if he has been diagnosed with any type of mental illness in the past.  However he has been psychiatrically hospitalized 3 times in the past.  His last admission was roughly 20 years ago.  He has attempted suicide at least 3 times in the past by means of slitting his wrist and overdosing.  He is currently not on any medications.  He has no outpatient  psychiatric resources in place.  He denies using any substances.  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  He lives alone.   Patient reports 1 year ago he lost his mother and a few days later his brother committed suicide.  Since that time he has noticed that his depression and anxiety have increased.  After he quit drinking and using cocaine roughly 40 days ago that is when he noticed his symptoms intensified.  Over the past few days he has had intrusive suicidal thoughts.  Reports, "I do not want to hurt myself but right now I keep having these thoughts to kill myself and I cannot trust myself not to do anything".    During evaluation Christian Andrews is sitting in the assessment room in no acute distress.  He is alert/oriented x4 and cooperative.  He is fairly groomed and makes good eye contact.  He has normal speech.  He endorses depression with feelings of helplessness, hopelessness, guilt, decreased motivation, decreased sleep and decreased appetite.  He has a congruent depressed affect.  He sleeps roughly 3-4 hours per night.  He has lost roughly 35 pounds over the past year.  Reports he has never gotten over losing his mother and brother and he is concerned that his landlord will make an vacate the home he is living in.  He endorses suicidal ideations with a plan to jump off of a bridge or overdose.  He cannot contract for safety.  He denies HI/AVH.  He does not appear to be responding to internal/external stimuli.  He denies delusional or paranoid thought content.    Discussed inpatient psychiatric admission with patient and he is in agreement.  [facility] notified.  Total Time spent with patient: 20 minutes  Past Psychiatric History: Reported hx of MDD Past Medical History: History reviewed. No pertinent past medical history. History reviewed. No pertinent surgical history. Family History: History reviewed. No pertinent family history. Family Psychiatric History:  Pt reports family psychiatric hx is positive for bipolar disorder (mother, brother). Social History:  Social History   Substance and Sexual Activity  Alcohol Use Yes     Social History   Substance and Sexual Activity  Drug Use Never    Social History   Socioeconomic History   Marital status: Single    Spouse name: Not on file   Number of children: Not on file   Years of education: Not on file   Highest education level: Not on file  Occupational History   Not on file  Tobacco Use   Smoking status: Some Days    Packs/day: 1.00    Types: Cigarettes   Smokeless tobacco: Never  Substance and Sexual Activity   Alcohol use: Yes   Drug use: Never   Sexual activity: Not on file  Other Topics Concern   Not on file  Social History Narrative   Not on file   Social Determinants of Health   Financial Resource Strain: Not on file  Food Insecurity: Not on file  Transportation Needs: Not on file  Physical Activity: Not on file  Stress: Not on file  Social Connections: Not on file   SDOH:  SDOH Screenings   Alcohol Screen: Not on file  Depression (PHQ2-9): Not on file  Financial Resource Strain: Not on file  Food Insecurity: Not on file  Housing: Not on file  Physical Activity: Not on file  Social Connections: Not on file  Stress: Not on file  Tobacco Use: High Risk (04/25/2022)   Patient History    Smoking Tobacco Use: Some Days    Smokeless Tobacco Use: Never    Passive Exposure: Not on file  Transportation Needs: Not on file    Tobacco Cessation:    Current Medications:  Current Facility-Administered Medications  Medication Dose Route Frequency Provider Last Rate Last Admin   acetaminophen (TYLENOL) tablet 650 mg  650 mg Oral Q6H PRN Ardis Hughs, NP       alum & mag hydroxide-simeth (MAALOX/MYLANTA) 200-200-20 MG/5ML suspension 30 mL  30 mL Oral Q4H PRN Ardis Hughs, NP       escitalopram (LEXAPRO) tablet 10 mg  10 mg Oral Daily Ardis Hughs,  NP   10 mg at 04/26/22 4098   hydrOXYzine (ATARAX) tablet 25 mg  25 mg Oral TID PRN Ardis Hughs, NP   25 mg at 04/25/22 2113   magnesium hydroxide (MILK OF MAGNESIA) suspension 30 mL  30 mL Oral Daily PRN Ardis Hughs, NP       nicotine (NICODERM CQ - dosed in mg/24 hours) patch 21 mg  21 mg Transdermal Daily Onuoha, Chinwendu V, NP   21 mg at 04/26/22 0906   traZODone (DESYREL) tablet 50 mg  50 mg Oral QHS PRN Ardis Hughs, NP   50 mg at 04/25/22 2113   Current Outpatient Medications  Medication Sig Dispense Refill   Hydrocortisone (CORTIZONE-10 EX) Apply  1 Application topically daily as needed (For rash).      PTA Medications: (Not in a hospital admission)       No data to display          Flowsheet Row ED from 04/25/2022 in [facility] ED from 09/20/2021 in [facility] from 04/29/2021 in [facility]  C-SSRS RISK CATEGORY High Risk No Risk No Risk       Musculoskeletal  Strength & Muscle Tone: within normal limits Gait & Station: normal Patient leans: N/A  Psychiatric Specialty Exam  Presentation  General Appearance: Casual  Eye Contact:Good  Speech:Clear and Coherent; Normal Rate  Speech Volume:Normal  Handedness:Right   Mood and Affect  Mood:Anxious; Depressed; Hopeless  Affect:Congruent   Thought Process  Thought Processes:Coherent  Descriptions of Associations:Intact  Orientation:Full (Time, Place and Person)  Thought Content:Logical  Diagnosis of Schizophrenia or Schizoaffective disorder in past: No    Hallucinations:Hallucinations: None  Ideas of Reference:None  Suicidal Thoughts:Suicidal Thoughts: No  Homicidal Thoughts:Homicidal Thoughts: No   Sensorium  Memory:Immediate Good; Recent Good  Judgment:Poor  Insight:Shallow   Executive Functions  Concentration:Good  Attention Span:Good  Recall:Good  Fund of  Knowledge:Good  Language:Good   Psychomotor Activity  Psychomotor Activity:Psychomotor Activity: Normal   Assets  Assets:Communication Skills; Desire for Improvement   Sleep  Sleep:Sleep: Poor  Nutritional Assessment (For OBS and FBC admissions only) Has the patient had a weight loss or gain of 10 pounds or more in the last 3 months?: Yes Has the patient had a decrease in food intake/or appetite?: Yes Does the patient have dental problems?: No Does the patient have eating habits or behaviors that may be indicators of an eating disorder including binging or inducing vomiting?: No Has the patient recently lost weight without trying?: 4 Has the patient been eating poorly because of a decreased appetite?: 1 Malnutrition Screening Tool Score: 5   Physical Exam  Physical Exam Cardiovascular:     Rate and Rhythm: Normal rate.  Pulmonary:     Effort: Pulmonary effort is normal.  Neurological:     Mental Status: He is alert and oriented to person, place, and time.  Psychiatric:        Attention and Perception: Attention and perception normal.        Mood and Affect: Mood is depressed.        Speech: Speech normal.        Behavior: Behavior normal. Behavior is cooperative.    Review of Systems  Constitutional:  Negative for chills and fever.  Respiratory:  Negative for shortness of breath.   Cardiovascular:  Negative for chest pain and palpitations.  Gastrointestinal:  Negative for abdominal pain.  Psychiatric/Behavioral:  Positive for depression. The patient is nervous/anxious.    Blood pressure 100/74, pulse 100, temperature 98.3 F (36.8 C), resp. rate 18, SpO2 99 %. There is no height or weight on file to calculate BMI.  Demographic Factors:  Male, Caucasian, and Living alone  Loss Factors: Decrease in vocational status  Historical Factors: Prior suicide attempts and Family history of mental illness or substance abuse  Risk Reduction Factors:   NA  Continued  Clinical Symptoms:  Previous Psychiatric Diagnoses and Treatments  Cognitive Features That Contribute To Risk:  None    Suicide Risk:  Severe:  Frequent, intense, and enduring suicidal ideation, specific plan, no subjective intent, but some objective markers of intent (i.e., choice of lethal method), the method  is accessible, some limited preparatory behavior, evidence of impaired self-control, severe dysphoria/symptomatology, multiple risk factors present, and few if any protective factors, particularly a lack of social support.  Plan Of Care/Follow-up recommendations:  Inpatient admission  Disposition:  Inpatient admission  Tharon Aquas, NP 04/26/2022, 10:17 AM

## 2022-04-26 NOTE — BHH Suicide Risk Assessment (Cosign Needed)
[facility] Admission Suicide Risk Assessment   Nursing information obtained from:    Demographic factors:  Male, Unemployed, Living alone, Low socioeconomic status Current Mental Status:  Suicidal ideation indicated by patient Loss Factors:  Financial problems / change in socioeconomic status Historical Factors:  Prior suicide attempts Risk Reduction Factors:  NA   Principal Problem: Major depressive disorder, recurrent episode, severe (HCC) Diagnosis:  Principal Problem:   Major depressive disorder, recurrent episode, severe (H[facility]) Active Problems:   Generalized anxiety disorder   Alcohol use disorder, severe, in early remission (HCC)   Stimulant use disorder  Subjective Data:    Christian Andrews is a 41 year old male with a past psychiatric history of depression.  He presented voluntarily to the [facility] behavioral health urgent care on 7/9 for suicidal thoughts with a plan to jump off a bridge.  He reports multiple stressors including the death of his mother and subsequent suicide of his brother as well as difficulty paying bills recently.  He currently meets criteria for major depressive disorder and generalized anxiety disorder, as well as alcohol use disorder and stimulant use disorder, both in remission.  He is voluntarily admitted to the [facility] behavioral health hospital.     Mode of transport to Hospital: personal owned vehicle Current Outpatient (Home) Medication List: none PRN medication prior to evaluation: none   ED course: uneventful, no PRN medication POA/Legal Guardian: None   HPI:  The patient last reports experiencing suicidal thoughts on Monday, when he reported to the behavioral health urgent care with suicidal thoughts with a plan to jump off a bridge.  He states 2 main stressors are contributing to his current depression and suicidal thoughts.  The first is the death of his mother 1.5 years ago.  After his mother died, he reports that his brother, who is a heroin addict,  killed himself.  The patient reports a very close relationship with his mother.  The other main stressor the patient reports is the fact that the patient took a leave of absence from his job for approximately 50 days and finally quit his job on Friday which has resulted in bills piling up that he cannot pay.  He reports that he decided to leave his job because he works in [employer] where cocaine and alcohol use are omnipresent.  He states both the substances have been problems for him previously.  He reports that he has been sober from both of them for the entirety of his leave of absence.    The patient reports last experiencing suicidal thoughts on Monday.  He denies present suicidal ideation.  Patient denies any recent homicidal thoughts.   The patient reports that he has "always been depressed".  For the past 1 year he states that his depression has been particularly bad.  He states that he has no volition and states that he lost a significant amount of weight.  He reports pan positive depressive symptoms, save for appetite, which the patient says is still intact.  He even endorses psychomotor slowing.   Psychiatric review of symptoms is notable for prominent anxiety symptoms.  Patient meets criteria for generalized anxiety disorder reports racing thoughts about finances and about others' opinion of himself.  Bipolar affective disorder screening is negative.  PTSD screening is negative.  Patient has known exposure to previous traumatic events.  Patient denies previous psychotic symptoms.  Borderline personality disorder screening is negative.   Past Psychiatric Hx: The patient reports a past history of depression and  states that at one time he was diagnosed with borderline personality disorder but he disagrees with this diagnosis.  He reports a brief psychiatric hospitalization in [facility].  He believes it was a state hospital stay.  He reports that he was only there for 2  days when he was 41 years old.  He reports a past history of 3 overdose attempts in his 20s/early teens and 1 episode of wrist cutting.  He denies psychiatric hospitalizations within the last 20 years.  He reports taking no regularly prescribed psychiatric medications except for an antidepressant that starts with a "T".  The patient believes that it is trazodone after the provider names this medication.   Substance Abuse Hx: Patient reports frequent and intense use of alcohol and cocaine for most of his life.  He reports being sober from these substances for the past 40 days.  He denies the use of other illegal drugs.  He reports that he smokes cigarettes.  UDS is negative.   Past Medical History: He denies any previous history of medical diagnoses states that he is never had a seizure before.   Family History: Completed suicide in his brother, who also suffered from heroin addiction and bipolar affective disorder.  The patient also reports that his mother had bipolar affective disorder.      Social History: The patient is currently renting a home in [place].  His finances are limited presently because he has not been working.  He reports growing up in [place] until he is 41 years old at which time he moved to [place] to take care of his mother.  Presently he reports having 2 family members but in the area.  His biological father is in [place] and the patient refers to him as "more of a friend".  He also reports that his 6 year old grandmother lives in [place].   Continued Clinical Symptoms:  Alcohol Use Disorder Identification Test Final Score (AUDIT): 0 The "Alcohol Use Disorders Identification Test", Guidelines for Use in Primary Care, Second Edition.  World Science writer [facility]). Score between 0-7:  no or low risk or alcohol related problems. Score between 8-15:  moderate risk of alcohol related problems. Score between 16-19:  high risk of alcohol related  problems. Score 20 or above:  warrants further diagnostic evaluation for alcohol dependence and treatment.   CLINICAL FACTORS:   Severe Anxiety and/or Agitation Depression:   Severe   Musculoskeletal: Strength & Muscle Tone: within normal limits Gait & Station: normal Patient leans: N/A  Psychiatric Specialty Exam:  Presentation  General Appearance: Casual  Eye Contact:Good  Speech:Clear and Coherent; Normal Rate  Speech Volume:Normal  Handedness:Right   Mood and Affect  Mood:Anxious; Depressed; Hopeless  Affect:Congruent   Thought Process  Thought Processes:Coherent  Descriptions of Associations:Intact  Orientation:Full (Time, Place and Person)  Thought Content:Logical  History of Schizophrenia/Schizoaffective disorder:No  Duration of Psychotic Symptoms:No data recorded Hallucinations:Hallucinations: None  Ideas of Reference:None  Suicidal Thoughts: denies (last reported on Monday) Homicidal Thoughts:Homicidal Thoughts: No   Sensorium  Memory:Immediate Good; Recent Good  Judgment:Poor  Insight:Shallow   Executive Functions  Concentration:Good  Attention Span:Good  Recall:Good  Fund of Knowledge:Good  Language:Good   Psychomotor Activity  Psychomotor Activity:Psychomotor Activity: Normal   Assets  Assets:Communication Skills; Desire for Improvement   Sleep  Sleep:Sleep: Poor Number of Hours of Sleep: 2  Physical Exam Constitutional:      Appearance: the patient is not toxic-appearing.  Pulmonary:     Effort: Pulmonary effort is  normal.  Neurological:     General: No focal deficit present.     Mental Status: the patient is alert and oriented to person, place, and time.   Review of Systems  Respiratory:  Negative for shortness of breath.   Cardiovascular:  Negative for chest pain.  Gastrointestinal:  Negative for abdominal pain, constipation, diarrhea, nausea and vomiting.  Neurological:  Negative for headaches.    Blood pressure 112/80, pulse (!) 115, temperature 98 F (36.7 C), temperature source Oral, resp. rate 18, height 6\' 2"  (1.88 m), weight 67.9 kg, SpO2 100 %. Body mass index is 19.23 kg/m.   COGNITIVE FEATURES THAT CONTRIBUTE TO RISK:  None    SUICIDE RISK:   Moderate: Patient with a reported past history of suicide attempts and self-injurious behavior.  He currently lacks robust protective factors.  He presented with suicidal thoughts.  Expect the risk assessment to change with medication management and opportunity to participate in group therapy.  PLAN OF CARE:   Safety and Monitoring:             --  Voluntary admission to inpatient psychiatric unit for safety, stabilization and treatment             -- Daily contact with patient to assess and evaluate symptoms and progress in treatment             -- Patient's case to be discussed in multi-disciplinary team meeting             -- Observation Level : q15 minute checks             -- Vital signs:  q12 hours             -- Precautions: suicide, elopement, and assault   2. Psychiatric Diagnoses and Treatment:  Major depressive disorder, recurrent, severe Generalized anxiety disorder --Start Effexor 75 mg daily for mood and anxiety -- The risks/benefits/side-effects/alternatives to this medication were discussed in detail with the patient and time was given for questions. The patient consents to medication trial.              -- Encouraged patient to participate in unit milieu and in scheduled group therapies              -- Short Term Goals: Ability to identify changes in lifestyle to reduce recurrence of condition will improve             -- Long Term Goals: Improvement in symptoms so as ready for discharge                3. Medical Issues Being Addressed:              Tobacco Use Disorder             -- Nicotine patch 21mg /24 hours ordered             -- Smoking cessation encouraged             No need for CIWA or COWS given  patient's sobriety, however, will continue to observe    4. Discharge Planning:              -- Social work and case management to assist with discharge planning and identification of hospital follow-up needs prior to discharge             -- Estimated LOS: 5-7 days             -- Discharge Concerns:  Need to establish a safety plan; Medication compliance and effectiveness             -- Discharge Goals: Return home with outpatient referrals for mental health follow-up including medication management/psychotherapy             --Patient has declined residential rehab  I certify that inpatient services furnished can reasonably be expected to improve the patient's condition.   Corky Sox, MD 04/26/2022, 6:20 PM

## 2022-04-26 NOTE — Group Note (Signed)
LCSW Group Therapy Note   Group Date: 04/26/2022 Start Time: 1300 End Time: 1400  Type of Therapy/Topic:  Group Therapy:  Balance in Life  Participation Level:  Active  Description of Group:    This group will address the concept of balance and how it feels and looks when one is unbalanced. Patients will be encouraged to process areas in their lives that are out of balance and identify reasons for remaining unbalanced. Facilitators will guide patients in utilizing problem-solving interventions to address and correct the stressor making their life unbalanced. Understanding and applying boundaries will be explored and addressed for obtaining and maintaining a balanced life. Patients will be encouraged to explore ways to assertively make their unbalanced needs known to significant others in their lives, using other group members and facilitator for support and feedback.  Therapeutic Goals: Patient will identify two or more emotions or situations they have that consume much of in their lives. Patient will identify two ways to set boundaries in order to achieve balance in their lives:   Summary of Patient Progress:  The Pt attended group and remained there the entire time.  The Pt shared openly and participated throughout the group session.  The Pt was appropriate with peers and demonstrated understanding of the topic and ideas being discussed.  The Pt was able to reflect on their emotions and how they can create balance and emotional regulation in their every day life.     Therapeutic Modalities:   Cognitive Behavioral Therapy Solution-Focused Therapy Assertiveness Training  Aram Beecham, [place] 04/26/2022  2:11 PM

## 2022-04-26 NOTE — ED Notes (Signed)
Pt sleeping at present, no distress noted,  Monitoring for safety. 

## 2022-04-26 NOTE — Tx Team (Signed)
Initial Treatment Plan 04/26/2022 12:04 PM Hel[facility] Recupero RWE:[id number]    PATIENT STRESSORS: Financial difficulties   Marital or family conflict   Occupational concerns     PATIENT STRENGTHS: Capable of independent living  Motivation for treatment/growth  Supportive family/friends    PATIENT IDENTIFIED PROBLEMS: Depression  Anxiety  SI thoughts   Family conflict  Hopelessness              DISCHARGE CRITERIA:  Ability to meet basic life and health needs Improved stabilization in mood, thinking, and/or behavior Verbal commitment to aftercare and medication compliance  PRELIMINARY DISCHARGE PLAN: Attend PHP/IOP Outpatient therapy Return to previous living arrangement  PATIENT/FAMILY INVOLVEMENT: This treatment plan has been presented to and reviewed with the patient, Christian Andrews.  The patient has been given the opportunity to ask questions and make suggestions.  Sofie Hartigan, RN 04/26/2022, 12:04 PM

## 2022-04-26 NOTE — Progress Notes (Signed)
Admission note: Pt went to [facility] due to SI with a plan to take an entire bottle of sleeping pills. Pt did take some sleeping pills but only to go to sleep but thought about taking the while bottle. Pt stated that he feels hopeless and has had no motivation to do anything. Pt stated he was calling out of work and no longer has a job. Pt stressors include financial, family conflict, the death of his mother and brother a little over a year ago, and not knowing what to do with himself. Pt stated that he has been 4 months clean from coke and alcohol. Pt stated that he felt like everything was piling on him and he felt like he needed to get away. Pt stated that's when he started to have SI. Pt stated even though he is clean, he does not know what to do with his time and feels like days go on forever. Pt stated that there is just not a point anymore for him but he does want the help. Pt stated that he hasn't asked for help yet and feels like he finally needed to. Pt is open to trying meds. Pt stated his two goals was to not be hopeless and to try to find meaning and be more open about what's going on in his head. Pt lives by himself in a house and his support is friends. Consents signed, handbook detailing the patient's rights, responsibilities, and visitor guidelines provided. Skin/belongings search completed and patient oriented to unit. Patient stable at this time. Patient given the opportunity to express concerns and ask questions. Patient given toiletries. Will continue to monitor.    04/26/22 1035  Psych Admission Type (Psych Patients Only)  Admission Status Voluntary  Psychosocial Assessment  Patient Complaints Agitation;Decreased concentration;Anxiety;Depression;Hopelessness;Irritability;Loneliness;Nervousness;Sadness;Worrying;Tension  Eye Contact Fair  Facial Expression Flat;Sad  Affect Anxious;Depressed  Speech Logical/coherent;Soft  Interaction Assertive  Motor Activity Slow  Appearance/Hygiene  Unremarkable  Behavior Characteristics Cooperative;Appropriate to situation;Anxious  Mood Depressed;Anxious;Pleasant;Worthless, low self-esteem  Thought Process  Coherency WDL  Content WDL;Blaming self  Delusions None reported or observed  Perception WDL  Hallucination None reported or observed  Judgment Impaired  Confusion None  Danger to Self  Current suicidal ideation? Denies  Danger to Others  Danger to Others None reported or observed

## 2022-04-27 MED ORDER — TRAZODONE HCL 50 MG PO TABS
50.0000 mg | ORAL_TABLET | Freq: Once | ORAL | Status: DC
  Filled 2022-04-27: qty 1

## 2022-04-27 NOTE — Progress Notes (Addendum)
[facility] MD Progress Note  04/27/2022 5:01 PM Christian Andrews  MRN:  [id number] Subjective:   Christian Deter (Sam) Sheeler is a 41 year old male with a past psychiatric history of depression.  He presented voluntarily to the [facility] behavioral health urgent care on 7/9 for suicidal thoughts with a plan to jump off a bridge.  He reports multiple stressors including the death of his mother and subsequent suicide of his brother as well as difficulty paying bills recently.  He currently meets criteria for major depressive disorder and generalized anxiety disorder, as well as alcohol use disorder and stimulant use disorder, both in remission.  He is voluntarily admitted to the [facility] behavioral health hospital.  Yesterday, the following recommendations were made:  --Start Effexor 75 mg daily for mood and anxiety   Interval History: PRN Medications administered within the last 24 hours: Trazodone, atarax x1 Per nursing staff: denies depression and suicidal thoughts, reports significant anxiety   Per Patient:  On assessment today, the patient reports an improving mood.  He is found reading a Vernie Murders book and states that he has been enjoying this.  He reports that he has been going to groups and going to the gym.  He reports, unfortunately, his sleep was disrupted by bad dreams.  He reports tolerating the Effexor well and feels hopeful about this medication.  He denies suicidal homicidal thoughts.  He denies auditory or visual hallucinations.  He reports some interest in a sober living situation after discharge.  Will discuss with LCSW.  Patient denies side effects to current scheduled psychiatric medications.   Patient denies other somatic complaints.    Principal Problem: Major depressive disorder, recurrent episode, severe (HCC) Diagnosis: Principal Problem:   Major depressive disorder, recurrent episode, severe (HCC) Active Problems:   Generalized anxiety disorder   Alcohol use disorder, severe, in early  remission (HCC)   Stimulant use disorder  Total Time spent with patient: 15 minutes  Past Psychiatric History:  The patient reports a past history of depression and states that at one time he was diagnosed with borderline personality disorder but he disagrees with this diagnosis.  He reports a brief psychiatric hospitalization in [facility].  He believes it was a state hospital stay.  He reports that he was only there for 2 days when he was 41 years old.  He reports a past history of 3 overdose attempts in his 20s/early teens and 1 episode of wrist cutting.  He denies psychiatric hospitalizations within the last 20 years.  He reports taking no regularly prescribed psychiatric medications except for an antidepressant that starts with a "T".  The patient believes that it is trazodone after the provider names this medication.  Past Medical History: History reviewed. No pertinent past medical history. History reviewed. No pertinent surgical history. Family History: History reviewed. No pertinent family history. Family History: Completed suicide in his brother, who also suffered from heroin addiction and bipolar affective disorder.  The patient also reports that his mother had bipolar affective disorder.   Social History:  Social History   Substance and Sexual Activity  Alcohol Use Not Currently     Social History   Substance and Sexual Activity  Drug Use Not Currently   Types: Cocaine    Social History   Socioeconomic History   Marital status: Single    Spouse name: Not on file   Number of children: Not on file   Years of education: Not on file   Highest education level: Not on  file  Occupational History   Not on file  Tobacco Use   Smoking status: Every Day    Packs/day: 0.75    Types: Cigarettes   Smokeless tobacco: Never  Substance and Sexual Activity   Alcohol use: Not Currently   Drug use: Not Currently    Types: Cocaine   Sexual activity: Yes  Other Topics  Concern   Not on file  Social History Narrative   Not on file   Social Determinants of Health   Financial Resource Strain: Not on file  Food Insecurity: Not on file  Transportation Needs: Not on file  Physical Activity: Not on file  Stress: Not on file  Social Connections: Not on file   Additional Social History:      Sleep: Poor  Appetite:  Fair  Current Medications: Current Facility-Administered Medications  Medication Dose Route Frequency Provider Last Rate Last Admin   acetaminophen (TYLENOL) tablet 650 mg  650 mg Oral Q6H PRN Lauree Chandler, NP       alum & mag hydroxide-simeth (MAALOX/MYLANTA) 200-200-20 MG/5ML suspension 30 mL  30 mL Oral Q4H PRN Lauree Chandler, NP       feeding supplement (ENSURE ENLIVE / ENSURE PLUS) liquid 237 mL  237 mL Oral BID BM Massengill, Harrold Donath, MD   237 mL at 04/27/22 1424   hydrOXYzine (ATARAX) tablet 25 mg  25 mg Oral TID PRN Lauree Chandler, NP   25 mg at 04/26/22 2132   magnesium hydroxide (MILK OF MAGNESIA) suspension 30 mL  30 mL Oral Daily PRN Lauree Chandler, NP       nicotine (NICODERM CQ - dosed in mg/24 hours) patch 21 mg  21 mg Transdermal Daily Massengill, Harrold Donath, MD   21 mg at 04/27/22 0742   traZODone (DESYREL) tablet 50 mg  50 mg Oral QHS PRN Lauree Chandler, NP   50 mg at 04/26/22 2132   venlafaxine XR (EFFEXOR-XR) 24 hr capsule 75 mg  75 mg Oral Q breakfast Massengill, Harrold Donath, MD   75 mg at 04/27/22 0743    Lab Results:  Results for orders placed or performed during the hospital encounter of 04/25/22 (from the past 48 hour(s))  CBC with Differential/Platelet     Status: Abnormal   Collection Time: 04/25/22  5:02 PM  Result Value Ref Range   WBC 6.6 4.0 - 10.5 K/uL   RBC 3.97 (L) 4.22 - 5.81 MIL/uL   Hemoglobin 12.7 (L) 13.0 - 17.0 g/dL   HCT 78.2 (L) 95.6 - 21.3 %   MCV 97.0 80.0 - 100.0 fL   MCH 32.0 26.0 - 34.0 pg   MCHC 33.0 30.0 - 36.0 g/dL   RDW 08.6 57.8 - 46.9 %   Platelets 236 150 - 400  K/uL   nRBC 0.0 0.0 - 0.2 %   Neutrophils Relative % 66 %   Neutro Abs 4.4 1.7 - 7.7 K/uL   Lymphocytes Relative 22 %   Lymphs Abs 1.4 0.7 - 4.0 K/uL   Monocytes Relative 6 %   Monocytes Absolute 0.4 0.1 - 1.0 K/uL   Eosinophils Relative 5 %   Eosinophils Absolute 0.3 0.0 - 0.5 K/uL   Basophils Relative 1 %   Basophils Absolute 0.1 0.0 - 0.1 K/uL   Immature Granulocytes 0 %   Abs Immature Granulocytes 0.02 0.00 - 0.07 K/uL    Comment: Performed at [facility], [address], [place], [place] [address]  Comprehensive metabolic panel  Status: Abnormal   Collection Time: 04/25/22  5:02 PM  Result Value Ref Range   Sodium 138 135 - 145 mmol/L   Potassium 3.7 3.5 - 5.1 mmol/L   Chloride 108 98 - 111 mmol/L   CO2 23 22 - 32 mmol/L   Glucose, Bld 93 70 - 99 mg/dL    Comment: Glucose reference range applies only to samples taken after fasting for at least 8 hours.   BUN 8 6 - 20 mg/dL   Creatinine, Ser 7.02 0.61 - 1.24 mg/dL   Calcium 9.0 8.9 - 63.7 mg/dL   Total Protein 6.2 (L) 6.5 - 8.1 g/dL   Albumin 3.8 3.5 - 5.0 g/dL   AST 21 15 - 41 U/L   ALT 18 0 - 44 U/L   Alkaline Phosphatase 38 38 - 126 U/L   Total Bilirubin 0.6 0.3 - 1.2 mg/dL   GFR, Estimated >85 >88 mL/min    Comment: (NOTE) Calculated using the CKD-EPI Creatinine Equation (2021)    Anion gap 7 5 - 15    Comment: Performed at [facility], [address], [place], [place] [address]  Hemoglobin A1c     Status: Abnormal   Collection Time: 04/25/22  5:02 PM  Result Value Ref Range   Hgb A1c MFr Bld 5.7 (H) 4.8 - 5.6 %    Comment: (NOTE) Pre diabetes:          5.7%-6.4%  Diabetes:              >6.4%  Glycemic control for   <7.0% adults with diabetes    Mean Plasma Glucose 116.89 mg/dL    Comment: Performed at [facility], [address] [place], [place] [address]  Magnesium     Status: None   Collection Time: 04/25/22  5:02 PM  Result Value Ref Range   Magnesium 2.0 1.7 - 2.4 mg/dL     Comment: Performed at [facility], [address], [place], [place] [address]  Ethanol     Status: None   Collection Time: 04/25/22  5:02 PM  Result Value Ref Range   Alcohol, Ethyl (B) <10 <10 mg/dL    Comment: (NOTE) Lowest detectable limit for serum alcohol is 10 mg/dL.  For medical purposes only. Performed at [facility], [address] [place], [place] [address]   Lipid panel     Status: Abnormal   Collection Time: 04/25/22  5:02 PM  Result Value Ref Range   Cholesterol 183 0 - 200 mg/dL   Triglycerides 96 <096 mg/dL   HDL 55 >28 mg/dL   Total CHOL/HDL Ratio 3.3 RATIO   VLDL 19 0 - 40 mg/dL   LDL Cholesterol 366 (H) 0 - 99 mg/dL    Comment:        Total Cholesterol/HDL:CHD Risk Coronary Heart Disease Risk Table                     Men   Women  1/2 Average Risk   3.4   3.3  Average Risk       5.0   4.4  2 X Average Risk   9.6   7.1  3 X Average Risk  23.4   11.0        Use the calculated Patient Ratio above and the CHD Risk Table to determine the patient's CHD Risk.        ATP III CLASSIFICATION (LDL):  <100  mg/dL   Optimal  329-518  mg/dL   Near or Above                    Optimal  130-159  mg/dL   Borderline  841-660  mg/dL   High  >630     mg/dL   Very High Performed at [facility], [address] [place], [place] [address]   TSH     Status: None   Collection Time: 04/25/22  5:02 PM  Result Value Ref Range   TSH 1.527 0.350 - 4.500 uIU/mL    Comment: Performed by a 3rd Generation assay with a functional sensitivity of <=0.01 uIU/mL. Performed at [facility], [address], [place], [place] [address]   RPR     Status: None   Collection Time: 04/25/22  5:02 PM  Result Value Ref Range   RPR Ser Ql NON REACTIVE NON REACTIVE    Comment: Performed at [facility], [address], [place], [place] [address]  Urinalysis, Complete w Microscopic PATH Cytology Urine     Status: Abnormal   Collection Time: 04/25/22  5:02 PM   Result Value Ref Range   Color, Urine COLORLESS (A) YELLOW   APPearance CLEAR CLEAR   Specific Gravity, Urine 1.002 (L) 1.005 - 1.030   pH 5.0 5.0 - 8.0   Glucose, UA NEGATIVE NEGATIVE mg/dL   Hgb urine dipstick NEGATIVE NEGATIVE   Bilirubin Urine NEGATIVE NEGATIVE   Ketones, ur NEGATIVE NEGATIVE mg/dL   Protein, ur NEGATIVE NEGATIVE mg/dL   Nitrite NEGATIVE NEGATIVE   Leukocytes,Ua NEGATIVE NEGATIVE   Bacteria, UA NONE SEEN NONE SEEN    Comment: Performed at [facility], [address] [place], [place] [address]  POCT Urine Drug Screen - (I-Screen)     Status: Normal   Collection Time: 04/25/22  5:02 PM  Result Value Ref Range   POC Amphetamine UR None Detected NONE DETECTED (Cut Off Level 1000 ng/mL)   POC Secobarbital (BAR) None Detected NONE DETECTED (Cut Off Level 300 ng/mL)   POC Buprenorphine (BUP) None Detected NONE DETECTED (Cut Off Level 10 ng/mL)   POC Oxazepam (BZO) None Detected NONE DETECTED (Cut Off Level 300 ng/mL)   POC Cocaine UR None Detected NONE DETECTED (Cut Off Level 300 ng/mL)   POC Methamphetamine UR None Detected NONE DETECTED (Cut Off Level 1000 ng/mL)   POC Morphine None Detected NONE DETECTED (Cut Off Level 300 ng/mL)   POC Methadone UR None Detected NONE DETECTED (Cut Off Level 300 ng/mL)   POC Oxycodone UR None Detected NONE DETECTED (Cut Off Level 100 ng/mL)   POC Marijuana UR None Detected NONE DETECTED (Cut Off Level 50 ng/mL)  POC SARS Coronavirus 2 Ag-ED - Nasal Swab     Status: Normal   Collection Time: 04/25/22  5:02 PM  Result Value Ref Range   SARS Coronavirus 2 Ag Negative Negative  POC SARS Coronavirus 2 Ag     Status: None   Collection Time: 04/25/22  5:10 PM  Result Value Ref Range   SARSCOV2ONAVIRUS 2 AG NEGATIVE NEGATIVE    Comment: (NOTE) SARS-CoV-2 antigen NOT DETECTED.   Negative results are presumptive.  Negative results do not preclude SARS-CoV-2 infection and should not be used as the sole basis for treatment or  other patient management decisions, including infection  control decisions, particularly in the presence of clinical signs and  symptoms consistent with COVID-19, or in those who  have been in contact with the virus.  Negative results must be combined with clinical observations, patient history, and epidemiological information. The expected result is Negative.  Fact Sheet for Patients: https://www.jennings-kim.com/  Fact Sheet for Healthcare Providers: https://alexander-rogers.biz/  This test is not yet approved or cleared by the [place] FDA and  has been authorized for detection and/or diagnosis of SARS-CoV-2 by FDA under an Emergency Use Authorization (EUA).  This EUA will remain in effect (meaning this test can be used) for the duration of  the COV ID-19 declaration under Section 564(b)(1) of the Act, 21 U.S.C. section 360bbb-3(b)(1), unless the authorization is terminated or revoked sooner.      Blood Alcohol level:  Lab Results  Component Value Date   ETH <10 04/25/2022   ETH <10 10/05/2020    Metabolic Disorder Labs: Lab Results  Component Value Date   HGBA1C 5.7 (H) 04/25/2022   MPG 116.89 04/25/2022   No results found for: "PROLACTIN" Lab Results  Component Value Date   CHOL 183 04/25/2022   TRIG 96 04/25/2022   HDL 55 04/25/2022   CHOLHDL 3.3 04/25/2022   VLDL 19 04/25/2022   LDLCALC 109 (H) 04/25/2022    Physical Findings: AIMS: Facial and Oral Movements Muscles of Facial Expression: None, normal Lips and Perioral Area: None, normal Jaw: None, normal Tongue: None, normal,Extremity Movements Upper (arms, wrists, hands, fingers): None, normal Lower (legs, knees, ankles, toes): None, normal, Trunk Movements Neck, shoulders, hips: None, normal, Overall Severity Severity of abnormal movements (highest score from questions above): None, normal Incapacitation due to abnormal movements: None, normal Patient's awareness of  abnormal movements (rate only patient's report): No Awareness, Dental Status Current problems with teeth and/or dentures?: No Does patient usually wear dentures?: No  CIWA:    COWS:     Musculoskeletal: Strength & Muscle Tone: within normal limits Gait & Station: normal Patient leans: N/A  Psychiatric Specialty Exam:   Presentation  General Appearance: Casual   Eye Contact:Good   Speech:Clear and Coherent; Normal Rate   Speech Volume:Normal   Handedness:Right     Mood and Affect  Mood: improving"  Affect: slightly constricted     Thought Process  Thought Processes:Coherent   Duration of Psychotic Symptoms: No data recorded Past Diagnosis of Schizophrenia or Psychoactive disorder: No   Descriptions of Associations:Intact   Orientation:Full (Time, Place and Person)   Thought Content:Logical   Hallucinations:Hallucinations: None   Ideas of Reference:None   Suicidal Thoughts: denies (has denied since admission) Homicidal Thoughts:Homicidal Thoughts: No     Sensorium  Memory:Immediate Good; Recent Good   Judgment:Poor   Insight:Shallow     Executive Functions  Concentration:Good   Attention Span:Good   Recall:Good   Fund of Knowledge:Good   Language:Good     Psychomotor Activity  Psychomotor Activity:Psychomotor Activity: Normal     Assets  Assets:Communication Skills; Desire for Improvement     Sleep  Sleep: poor     Physical Exam Constitutional:      Appearance: the patient is not toxic-appearing.  Pulmonary:     Effort: Pulmonary effort is normal.  Neurological:     General: No focal deficit present.     Mental Status: the patient is alert and oriented to person, place, and time.    Review of Systems  Respiratory:  Negative for shortness of breath.   Cardiovascular:  Negative for chest pain.  Gastrointestinal:  Negative for abdominal pain, constipation, diarrhea, nausea and vomiting.  Neurological:  Negative for headaches.  Blood pressure 101/65, pulse 81, temperature (!) 97.4 F (36.3 C), temperature source Oral, resp. rate 18, height 6\' 2"  (1.88 m), weight 67.9 kg, SpO2 97 %. Body mass index is 19.23 kg/m.  Diagnoses / Active Problems: Major depressive disorder, recurrent, severe Generalized anxiety disorder Alcohol use disorder, severe, in early remission Stimulant use disorder, severe, in early remission   PLAN: Safety and Monitoring:             --  Voluntary admission to inpatient psychiatric unit for safety, stabilization and treatment             -- Daily contact with patient to assess and evaluate symptoms and progress in treatment             -- Patient's case to be discussed in multi-disciplinary team meeting             -- Observation Level : q15 minute checks             -- Vital signs:  q12 hours             -- Precautions: suicide, elopement, and assault   2. Psychiatric Diagnoses and Treatment:  Major depressive disorder, recurrent, severe Generalized anxiety disorder --Continue Effexor 75 mg daily for mood and anxiety, plan to increase for 7/15 --Can consider addition of Remeron for sleep -- The risks/benefits/side-effects/alternatives to this medication were discussed in detail with the patient and time was given for questions. The patient consents to medication trial.              -- Encouraged patient to participate in unit milieu and in scheduled group therapies              -- Short Term Goals: Ability to identify changes in lifestyle to reduce recurrence of condition will improve             -- Long Term Goals: Improvement in symptoms so as ready for discharge                3. Medical Issues Being Addressed:              Tobacco Use Disorder             -- Nicotine patch 21mg /24 hours ordered             -- Smoking cessation encouraged             No need for CIWA or COWS given patient's sobriety, however, will continue to observe    4. Discharge Planning:              --  Social work and case management to assist with discharge planning and identification of hospital follow-up needs prior to discharge             -- Estimated LOS: 5-7 days             -- Discharge Concerns: Need to establish a safety plan; Medication compliance and effectiveness             -- Discharge Goals: Return home with outpatient referrals for mental health follow-up including medication management/psychotherapy             --Patient has declined residential rehab  --Patient may be interested in [facility] or [facility], will discuss with LCSW  Treatment Plan Summary: Daily contact with patient to assess and evaluate symptoms and progress in treatment and Medication management   Carlyn ReichertNick Fatimata Talsma, MD 04/27/2022, 5:01  PM

## 2022-04-27 NOTE — B[facility] Counselor (Signed)
Adult Comprehensive Assessment  Patient ID: Christian Andrews, male   DOB: 1981-03-05, 41 y.o.   MRN: [id number]  Information Source: Information source: Patient  Current Stressors:  Patient states their primary concerns and needs for treatment are:: "Depression and suicidal thoughts" Patient states their goals for this hospitilization and ongoing recovery are:: "To learn and get as much help as possible" Educational / Learning stressors: Pt reports having a 12th grade education and some college Employment / Job issues: Pt reports being unemployed and quitting his job 1 week ago Family Relationships: Pt reports being close with his father Surveyor, quantity / Lack of resources (include bankruptcy): Pt reports having no income and no Water engineer / Lack of housing: Pt reports living in a rental house Physical health (include injuries & life threatening diseases): Pt reports no stressors Social relationships: Pt reports no stressors Substance abuse: Pt reports he has been sober for 41 days from Cocaine, Alcohol, and Ecstasy Bereavement / Loss: Pt reports his mother passed away 1 year ago and his brother committed suicide a few days after his mother passed away.  Living/Environment/Situation:  Living Arrangements: Alone Living conditions (as described by patient or guardian): House/[place] Who else lives in the home?: Alone How long has patient lived in current situation?: 8 years What is atmosphere in current home: Comfortable  Family History:  Marital status: Single Are you sexually active?: Yes What is your sexual orientation?: Heterosexual Has your sexual activity been affected by drugs, alcohol, medication, or emotional stress?: No Does patient have children?: No  Childhood History:  By whom was/is the patient raised?: Mother Additional childhood history information: Pt reports having visitation with father during childhood.  Pt reports his father is more like his friend now  and that they have a close relationship. Description of patient's relationship with caregiver when they were a child: "My mother was the best" Patient's description of current relationship with people who raised him/her: "She passed away 1 year ago" How were you disciplined when you got in trouble as a child/adolescent?: Spankings and Groundings Does patient have siblings?: Yes Number of Siblings: 1 Description of patient's current relationship with siblings: "I had a brother but he committed suicide a few days after my mother passed away" Did patient suffer any verbal/emotional/physical/sexual abuse as a child?: No Did patient suffer from severe childhood neglect?: No Has patient ever been sexually abused/assaulted/raped as an adolescent or adult?: No Was the patient ever a victim of a crime or a disaster?: No Witnessed domestic violence?: No Has patient been affected by domestic violence as an adult?: No  Education:  Highest grade of school patient has completed: 12th grade, some college Currently a student?: No Learning disability?: No  Employment/Work Situation:   Employment Situation: Unemployed Patient's Job has Been Impacted by Current Illness: Yes Describe how Patient's Job has Been Impacted: Pt reports quitting his job 1 week ago due to mental health and substance use. What is the Longest Time Patient has Held a Job?: 10 years Where was the Patient Employed at that Time?: [employer] Has Patient ever Been in the [employer]?: No  Financial Resources:   Financial resources: No income Does patient have a Lawyer or guardian?: No  Alcohol/Substance Abuse:   What has been your use of drugs/alcohol within the last 12 months?: Pt reports being sober for 41 days but previously used Cocaine, Alcohol, and Ecstasy If attempted suicide, did drugs/alcohol play a role in this?: No Alcohol/Substance Abuse Treatment Hx: Denies past history  Has alcohol/substance abuse ever  caused legal problems?: No  Social Support System:   Patient's Community Support System: Good Describe Community Support System: Friends and Father Type of faith/religion: None How does patient's faith help to cope with current illness?: N/A  Leisure/Recreation:   Do You Have Hobbies?: Yes Leisure and Hobbies: Playing pool, walking outside, and reading  Strengths/Needs:   What is the patient's perception of their strengths?: Communication and meeting new people Patient states they can use these personal strengths during their treatment to contribute to their recovery: "I feel like I am offering something of myself to others" Patient states these barriers may affect/interfere with their treatment: None Patient states these barriers may affect their return to the community: None Other important information patient would like considered in planning for their treatment: None  Discharge Plan:   Currently receiving community mental health services: No Patient states concerns and preferences for aftercare planning are: Pt is interested in therapy and medication management Patient states they will know when they are safe and ready for discharge when: "I am not sure yet" Does patient have access to transportation?: Yes (Walking or [place]) Does patient have financial barriers related to discharge medications?: Yes Patient description of barriers related to discharge medications: No income and no medical insurance Will patient be returning to same living situation after discharge?: Yes  Summary/Recommendations:   Summary and Recommendations (to be completed by the evaluator): Christian Andrews is a 41 year old, male, who was admitted to the hospital due to worsening depression and suicidal thoughts.  The Pt reports that he stopped using substances 41 days ago and his symptoms worsened after that.  He states that he was unable to function at his job and he quit 1 week ago.  He states that he is currently  living at a rental home and uses walking and [place] as transportation.  He states that he has no income or medical insurance.  The Pt reports that he is close with his biological father but considers him "more of a friend then a father".  He states that his mother passed away approximately 1 year ago and his brother committed suicide a few days later.  He states that he has no other family contacts at this time.  He denies any childhood trauma or abuse.  The Pt reports that he has been sober for 41 days but was previously use Cocaine, Alcohol, and Ecstasy.  He denies any current or previous substance use treatment.  While in the hospital the Pt can benefit from crisis stabilization, medication evaluation, group therapy, psycho-education, case management, and discharge planning.  Upon discharge the Pt would like to return to his home.  It is recommended that the Pt follow-up with a local outpatient provider for therapy and medication management.  The Pt states that he is not interested in inpatient or outpatient substance use treatment at this time.  Aram Beecham. 04/27/2022

## 2022-04-27 NOTE — Progress Notes (Signed)

## 2022-04-27 NOTE — Progress Notes (Signed)
NUTRITION ASSESSMENT RD working remotely.  Pt identified as at risk on the Malnutrition Screen Tool  INTERVENTION: - continue Ensure Plus High Protein BID, each supplement provides 350 kcal and 20 grams of protein.  [facility] staff to continue to encourage PO intakes of meals, supplements, and snacks.    NUTRITION DIAGNOSIS: Unintentional weight loss related to sub-optimal intake as evidenced by pt report.   Goal: Pt to meet >/= 90% of their estimated nutrition needs.  Monitor:  PO intake  Assessment:  Patient admitted for suicidal thoughts with a plan to jump off of a bridge. He reported stressors include the death of his mother and subsequent suicide of his brother. He is also experiencing financial stressors.  Weight yesterday was 150 lb and the most recently documented weight PTA was 164 lb on 09/20/21.   41 y.o. male  Height: Ht Readings from Last 1 Encounters:  04/26/22 6\' 2"  (1.88 m)    Weight: Wt Readings from Last 1 Encounters:  04/26/22 67.9 kg    Weight Hx: Wt Readings from Last 10 Encounters:  04/26/22 67.9 kg  09/20/21 74.8 kg  02/28/18 93 kg  02/25/18 93 kg    BMI:  Body mass index is 19.23 kg/m. Pt meets criteria for normal weight based on current BMI.  Estimated Nutritional Needs: Kcal: 25-30 kcal/kg Protein: > 1 gram protein/kg Fluid: 1 ml/kcal  Diet Order:  Diet Order             Diet regular Room service appropriate? Yes; Fluid consistency: Thin  Diet effective now                  Pt is also offered choice of unit snacks mid-morning and mid-afternoon.  Pt is eating as desired.   Lab results and medications reviewed.      02/27/18, MS, RD, LDN, CNSC Registered Dietitian II Inpatient Clinical Nutrition RD pager # and on-call/weekend pager # available in [facility]

## 2022-04-27 NOTE — BHH Counselor (Deleted)
CSW provided the Pt with a packet that contains information including shelter and housing resources, list of [address], free and reduced price food information, clothing resources, crisis center information, a Good[employer] card, and suicide prevention information.

## 2022-04-27 NOTE — Progress Notes (Signed)
   04/27/22 0500  Sleep  Number of Hours 6.25

## 2022-04-27 NOTE — Progress Notes (Signed)
Adult Psychoeducational Group Note  Date:  04/27/2022 Time:  9:56 AM  Group Topic/Focus:  Goals Group:   The focus of this group is to help patients establish daily goals to achieve during treatment and discuss how the patient can incorporate goal setting into their daily lives to aide in recovery.  Participation Level:  Active  Participation Quality:  Appropriate  Affect:  Appropriate  Cognitive:  Appropriate  Insight: Appropriate  Engagement in Group:  Engaged  Modes of Intervention:  Discussion  Additional Comments:The patient expressed that her goal is to talk with SW.  Octavio Manns 04/27/2022, 9:56 AM

## 2022-04-27 NOTE — Progress Notes (Addendum)
Pt denies SI/HI/AVH and verbally agrees to approach staff if these become apparent or before harming themselves/others. Rates depression 5/10. Rates anxiety 7/10. Rates pain 0/10. Pt has been in and out of his room throughout the day but has been interacting well with others and has gone to groups. Pt says he is feeling a little better today. Scheduled medications administered to pt, per MD orders. RN provided support and encouragement to pt. Q15 min safety checks implemented and continued. Pt safe on the unit. RN will continue to monitor and intervene as needed.   04/27/22 0742  Psych Admission Type (Psych Patients Only)  Admission Status Voluntary  Psychosocial Assessment  Patient Complaints Anxiety  Eye Contact Fair  Facial Expression Sad  Affect Anxious;Depressed  Speech Logical/coherent  Interaction Assertive  Motor Activity Slow  Appearance/Hygiene Unremarkable  Behavior Characteristics Cooperative;Appropriate to situation;Anxious  Mood Anxious;Depressed;Pleasant  Thought Process  Coherency Circumstantial  Content Blaming self  Delusions None reported or observed  Perception WDL  Hallucination None reported or observed  Judgment Impaired  Confusion None  Danger to Self  Current suicidal ideation? Denies  Danger to Others  Danger to Others None reported or observed

## 2022-04-27 NOTE — Progress Notes (Signed)
[facility] Group Notes:  (Nursing/MHT/Case Management/Adjunct)  Date:  04/27/2022  Time:  2000  Type of Therapy:   wrap up group  Participation Level:  Active  Participation Quality:  Appropriate, Attentive, Sharing, and Supportive  Affect:  Appropriate  Cognitive:  Alert  Insight:  Improving  Engagement in Group:  Engaged  Modes of Intervention:  Clarification, Education, and Support  Summary of Progress/Problems: Positive thinking and positive change were discussed.   Christian Andrews 04/27/2022, 9:54 PM

## 2022-04-28 ENCOUNTER — Encounter (HOSPITAL_COMMUNITY): Payer: Self-pay

## 2022-04-28 MED ORDER — MIRTAZAPINE 15 MG PO TABS
15.0000 mg | ORAL_TABLET | Freq: Every day | ORAL | Status: DC
  Administered 2022-04-29: 15 mg via ORAL
  Filled 2022-04-28 (×2): qty 1
  Filled 2022-04-28 (×2): qty 7
  Filled 2022-04-28 (×2): qty 1

## 2022-04-28 MED ORDER — MIRTAZAPINE 7.5 MG PO TABS
7.5000 mg | ORAL_TABLET | Freq: Every day | ORAL | Status: DC
  Filled 2022-04-28: qty 1

## 2022-04-28 MED ORDER — MIRTAZAPINE 7.5 MG PO TABS
7.5000 mg | ORAL_TABLET | Freq: Every day | ORAL | Status: AC
Start: 2022-04-28 — End: 2022-04-28
  Administered 2022-04-28: 7.5 mg via ORAL
  Filled 2022-04-28 (×2): qty 1

## 2022-04-28 NOTE — Group Note (Signed)
Recreation Therapy Group Note   Group Topic:Problem Solving  Group Date: 04/28/2022 Start Time: 0935 End Time: 0950 Facilitators: Caroll Rancher, LRT,CTRS Location: [address]   Goal Area(s) Addresses:  Patient will effectively work with peer towards shared goal.  Patient will identify skills used to make activity successful.  Patient will identify how skills used during activity can be used to reach post d/c goals.    Group Description:  Landing Pad. In teams of 3-5, patients were given 12 plastic drinking straws and an equal length of masking tape. Using the materials provided, patients were asked to build a landing pad to catch a golf ball dropped from approximately 5 feet in the air. All materials were required to be used by the team in their design. LRT facilitated post-activity discussion.   Affect/Mood: N/A   Participation Level: Did not attend    Clinical Observations/Individualized Feedback:     Plan: Continue to engage patient in RT group sessions 2-3x/week.   Caroll Rancher, Antonietta Jewel 04/28/2022 12:47 PM

## 2022-04-28 NOTE — Progress Notes (Addendum)
[facility] MD Progress Note  04/28/2022 3:29 PM Christian Andrews  MRN:  [id number] Subjective:    Christian Andrews is a 41 year old male with a past psychiatric history of depression.  He presented voluntarily to the [facility] behavioral health urgent care on 7/9 for suicidal thoughts with a plan to jump off a bridge.  He reports multiple stressors including the death of his mother and subsequent suicide of his brother as well as difficulty paying bills recently.  He currently meets criteria for major depressive disorder and generalized anxiety disorder, as well as alcohol use disorder and stimulant use disorder, both in remission.  He is voluntarily admitted to the [facility] behavioral health hospital.  Yesterday, the following recommendations were made:  --Continue Effexor at 75 mg daily   Interval History: PRN Medications administered within the last 24 hours: Trazodone, atarax x1 Per nursing staff: appears anxious and depressed but is relatively pleasant, denies SI   Per Patient:  On assessment today, the patient reports that his sleep has been poor.  He states that he has been lying down with racing thoughts each night.  He states that over the past 2 nights he has obtained only 4 hours of sleep each.  He states that he usually gets 7 to 8 hours of sleep.  He reports this is new since the initiation of Effexor.  Discussed switching medications to Remeron.  Patient was interested in this and benefits and risks were discussed.  He reports that his mood is "irritable" due to the lack of sleep.  He denies suicidal or homicidal thoughts.  He denies auditory or visual hallucinations.   Patient denies other side effects to current scheduled psychiatric medications.   Patient denies other somatic complaints.    Principal Problem: Major depressive disorder, recurrent episode, severe (HCC) Diagnosis: Principal Problem:   Major depressive disorder, recurrent episode, severe (HCC) Active Problems:    Generalized anxiety disorder   Alcohol use disorder, severe, in early remission (HCC)   Stimulant use disorder  Total Time spent with patient: 15 minutes  Past Psychiatric History:  The patient reports a past history of depression and states that at one time he was diagnosed with borderline personality disorder but he disagrees with this diagnosis.  He reports a brief psychiatric hospitalization in [facility].  He believes it was a state hospital stay.  He reports that he was only there for 2 days when he was 41 years old.  He reports a past history of 3 overdose attempts in his 20s/early teens and 1 episode of wrist cutting.  He denies psychiatric hospitalizations within the last 20 years.  He reports taking no regularly prescribed psychiatric medications except for an antidepressant that starts with a "T".  The patient believes that it is trazodone after the provider names this medication.  Past Medical History: History reviewed. No pertinent past medical history. History reviewed. No pertinent surgical history. Family History: History reviewed. No pertinent family history. Family History: Completed suicide in his brother, who also suffered from heroin addiction and bipolar affective disorder.  The patient also reports that his mother had bipolar affective disorder.   Social History:  Social History   Substance and Sexual Activity  Alcohol Use Not Currently     Social History   Substance and Sexual Activity  Drug Use Not Currently   Types: Cocaine    Social History   Socioeconomic History   Marital status: Single    Spouse name: Not on file  Number of children: Not on file   Years of education: Not on file   Highest education level: Not on file  Occupational History   Not on file  Tobacco Use   Smoking status: Every Day    Packs/day: 0.75    Types: Cigarettes   Smokeless tobacco: Never  Substance and Sexual Activity   Alcohol use: Not Currently   Drug use: Not  Currently    Types: Cocaine   Sexual activity: Yes  Other Topics Concern   Not on file  Social History Narrative   Not on file   Social Determinants of Health   Financial Resource Strain: Not on file  Food Insecurity: Not on file  Transportation Needs: Not on file  Physical Activity: Not on file  Stress: Not on file  Social Connections: Not on file   Additional Social History:      Sleep: Poor  Appetite:  Fair  Current Medications: Current Facility-Administered Medications  Medication Dose Route Frequency Provider Last Rate Last Admin   acetaminophen (TYLENOL) tablet 650 mg  650 mg Oral Q6H PRN Lauree Chandler, NP       alum & mag hydroxide-simeth (MAALOX/MYLANTA) 200-200-20 MG/5ML suspension 30 mL  30 mL Oral Q4H PRN Lauree Chandler, NP       feeding supplement (ENSURE ENLIVE / ENSURE PLUS) liquid 237 mL  237 mL Oral BID BM Massengill, Harrold Donath, MD   237 mL at 04/28/22 1503   hydrOXYzine (ATARAX) tablet 25 mg  25 mg Oral TID PRN Lauree Chandler, NP   25 mg at 04/28/22 0753   magnesium hydroxide (MILK OF MAGNESIA) suspension 30 mL  30 mL Oral Daily PRN Lauree Chandler, NP       nicotine (NICODERM CQ - dosed in mg/24 hours) patch 21 mg  21 mg Transdermal Daily Massengill, Harrold Donath, MD   21 mg at 04/28/22 0753   traZODone (DESYREL) tablet 50 mg  50 mg Oral QHS PRN Lauree Chandler, NP   50 mg at 04/27/22 2051   traZODone (DESYREL) tablet 50 mg  50 mg Oral Once Sindy Guadeloupe, NP       venlafaxine XR (EFFEXOR-XR) 24 hr capsule 75 mg  75 mg Oral Q breakfast Massengill, Harrold Donath, MD   75 mg at 04/28/22 1443    Lab Results:  No results found for this or any previous visit (from the past 48 hour(s)).   Blood Alcohol level:  Lab Results  Component Value Date   ETH <10 04/25/2022   ETH <10 10/05/2020    Metabolic Disorder Labs: Lab Results  Component Value Date   HGBA1C 5.7 (H) 04/25/2022   MPG 116.89 04/25/2022   No results found for: "PROLACTIN" Lab  Results  Component Value Date   CHOL 183 04/25/2022   TRIG 96 04/25/2022   HDL 55 04/25/2022   CHOLHDL 3.3 04/25/2022   VLDL 19 04/25/2022   LDLCALC 109 (H) 04/25/2022    Physical Findings: AIMS: Facial and Oral Movements Muscles of Facial Expression: None, normal Lips and Perioral Area: None, normal Jaw: None, normal Tongue: None, normal,Extremity Movements Upper (arms, wrists, hands, fingers): None, normal Lower (legs, knees, ankles, toes): None, normal, Trunk Movements Neck, shoulders, hips: None, normal, Overall Severity Severity of abnormal movements (highest score from questions above): None, normal Incapacitation due to abnormal movements: None, normal Patient's awareness of abnormal movements (rate only patient's report): No Awareness, Dental Status Current problems with teeth and/or dentures?: No Does patient usually wear dentures?:  No  CIWA:    COWS:     Musculoskeletal: Strength & Muscle Tone: within normal limits Gait & Station: normal Patient leans: N/A  Psychiatric Specialty Exam:   Presentation  General Appearance: Casual   Eye Contact:Good   Speech:Clear and Coherent; Normal Rate   Speech Volume:Normal   Handedness:Right     Mood and Affect  Mood: "irritable"  Affect: congruent, "irritable"     Thought Process  Thought Processes:Coherent   Duration of Psychotic Symptoms: No data recorded Past Diagnosis of Schizophrenia or Psychoactive disorder: No   Descriptions of Associations:Intact   Orientation:Full (Time, Place and Person)   Thought Content:Logical   Hallucinations:Hallucinations: None   Ideas of Reference:None   Suicidal Thoughts: denies (has denied since admission) Homicidal Thoughts:Homicidal Thoughts: No     Sensorium  Memory:Immediate Good; Recent Good   Judgment: fair   Insight: fair     Executive Functions  Concentration:Good   Attention Span:Good   Recall:Good   Fund of Knowledge:Good    Language:Good     Psychomotor Activity  Psychomotor Activity:Psychomotor Activity: Normal     Assets  Assets:Communication Skills; Desire for Improvement     Sleep  Sleep: poor     Physical Exam Constitutional:      Appearance: the patient is not toxic-appearing.  Pulmonary:     Effort: Pulmonary effort is normal.  Neurological:     General: No focal deficit present.     Mental Status: the patient is alert and oriented to person, place, and time.    Review of Systems  Respiratory:  Negative for shortness of breath.   Cardiovascular:  Negative for chest pain.  Gastrointestinal:  Negative for abdominal pain, constipation, diarrhea, nausea and vomiting.  Neurological:  Negative for headaches.  Blood pressure (!) 89/56, pulse (!) 105, temperature 97.7 F (36.5 C), temperature source Oral, resp. rate 18, height 6\' 2"  (1.88 m), weight 67.9 kg, SpO2 100 %. Body mass index is 19.23 kg/m.  Diagnoses / Active Problems: Major depressive disorder, recurrent, severe Generalized anxiety disorder Alcohol use disorder, severe, in early remission Stimulant use disorder, severe, in early remission   PLAN: Safety and Monitoring:             --  Voluntary admission to inpatient psychiatric unit for safety, stabilization and treatment             -- Daily contact with patient to assess and evaluate symptoms and progress in treatment             -- Patient's case to be discussed in multi-disciplinary team meeting             -- Observation Level : q15 minute checks             -- Vital signs:  q12 hours             -- Precautions: suicide, elopement, and assault   2. Psychiatric Diagnoses and Treatment:  Major depressive disorder, recurrent, severe Generalized anxiety disorder --Change to Remeron 7.5 mg QHS tonight for mood and sleep -- The risks/benefits/side-effects/alternatives to this medication were discussed in detail with the patient and time was given for questions. The  patient consents to medication trial.              -- Encouraged patient to participate in unit milieu and in scheduled group therapies              -- Short Term Goals: Ability to identify changes in  lifestyle to reduce recurrence of condition will improve             -- Long Term Goals: Improvement in symptoms so as ready for discharge                3. Medical Issues Being Addressed:              Tobacco Use Disorder             -- Nicotine patch 21mg /24 hours ordered             -- Smoking cessation encouraged             No need for CIWA or COWS given patient's sobriety, however, will continue to observe    4. Discharge Planning:              -- Social work and case management to assist with discharge planning and identification of hospital follow-up needs prior to discharge             -- Estimated LOS: 5-7 days             -- Discharge Concerns: Need to establish a safety plan; Medication compliance and effectiveness             -- Discharge Goals: Return home with outpatient referrals for mental health follow-up including medication management/psychotherapy             --Patient has declined residential rehab  --Patient plans to return home at discharge  Treatment Plan Summary: Daily contact with patient to assess and evaluate symptoms and progress in treatment and Medication management   04-03-1984, MD 04/28/2022, 3:29 PM

## 2022-04-28 NOTE — BH IP Treatment Plan (Signed)
Interdisciplinary Treatment and Diagnostic Plan Update  04/28/2022 Time of Session: 1000 Christian Andrews MRN: [id number]  Principal Diagnosis: Major depressive disorder, recurrent episode, severe (HCC)  Secondary Diagnoses: Principal Problem:   Major depressive disorder, recurrent episode, severe (HCC) Active Problems:   Generalized anxiety disorder   Alcohol use disorder, severe, in early remission (HCC)   Stimulant use disorder   Current Medications:  Current Facility-Administered Medications  Medication Dose Route Frequency Provider Last Rate Last Admin   acetaminophen (TYLENOL) tablet 650 mg  650 mg Oral Q6H PRN Lauree Chandler, NP       alum & mag hydroxide-simeth (MAALOX/MYLANTA) 200-200-20 MG/5ML suspension 30 mL  30 mL Oral Q4H PRN Lauree Chandler, NP       feeding supplement (ENSURE ENLIVE / ENSURE PLUS) liquid 237 mL  237 mL Oral BID BM Massengill, Harrold Donath, MD   237 mL at 04/28/22 1054   hydrOXYzine (ATARAX) tablet 25 mg  25 mg Oral TID PRN Lauree Chandler, NP   25 mg at 04/28/22 0753   magnesium hydroxide (MILK OF MAGNESIA) suspension 30 mL  30 mL Oral Daily PRN Lauree Chandler, NP       nicotine (NICODERM CQ - dosed in mg/24 hours) patch 21 mg  21 mg Transdermal Daily Massengill, Harrold Donath, MD   21 mg at 04/28/22 0753   traZODone (DESYREL) tablet 50 mg  50 mg Oral QHS PRN Lauree Chandler, NP   50 mg at 04/27/22 2051   traZODone (DESYREL) tablet 50 mg  50 mg Oral Once Sindy Guadeloupe, NP       venlafaxine XR (EFFEXOR-XR) 24 hr capsule 75 mg  75 mg Oral Q breakfast Massengill, Harrold Donath, MD   75 mg at 04/28/22 4098   PTA Medications: Medications Prior to Admission  Medication Sig Dispense Refill Last Dose   Hydrocortisone (CORTIZONE-10 EX) Apply 1 Application topically daily as needed (For rash).       Patient Stressors: Financial difficulties   Marital or family conflict   Occupational concerns    Patient Strengths: Capable of independent living  Motivation  for treatment/growth  Supportive family/friends   Treatment Modalities: Medication Management, Group therapy, Case management,  1 to 1 session with clinician, Psychoeducation, Recreational therapy.   Physician Treatment Plan for Primary Diagnosis: Major depressive disorder, recurrent episode, severe (HCC) Long Term Goal(s): Improvement in symptoms so as ready for discharge   Short Term Goals: Ability to identify changes in lifestyle to reduce recurrence of condition will improve Ability to maintain clinical measurements within normal limits will improve  Medication Management: Evaluate patient's response, side effects, and tolerance of medication regimen.  Therapeutic Interventions: 1 to 1 sessions, Unit Group sessions and Medication administration.  Evaluation of Outcomes: Progressing  Physician Treatment Plan for Secondary Diagnosis: Principal Problem:   Major depressive disorder, recurrent episode, severe (HCC) Active Problems:   Generalized anxiety disorder   Alcohol use disorder, severe, in early remission (HCC)   Stimulant use disorder  Long Term Goal(s): Improvement in symptoms so as ready for discharge   Short Term Goals: Ability to identify changes in lifestyle to reduce recurrence of condition will improve Ability to maintain clinical measurements within normal limits will improve     Medication Management: Evaluate patient's response, side effects, and tolerance of medication regimen.  Therapeutic Interventions: 1 to 1 sessions, Unit Group sessions and Medication administration.  Evaluation of Outcomes: Progressing   RN Treatment Plan for Primary Diagnosis: Major depressive disorder, recurrent episode, severe (HCC)  Long Term Goal(s): Knowledge of disease and therapeutic regimen to maintain health will improve  Short Term Goals: Ability to remain free from injury will improve, Ability to verbalize frustration and anger appropriately will improve, Ability to  demonstrate self-control, Ability to participate in decision making will improve, Ability to verbalize feelings will improve, Ability to disclose and discuss suicidal ideas, Ability to identify and develop effective coping behaviors will improve, and Compliance with prescribed medications will improve  Medication Management: RN will administer medications as ordered by provider, will assess and evaluate patient's response and provide education to patient for prescribed medication. RN will report any adverse and/or side effects to prescribing provider.  Therapeutic Interventions: 1 on 1 counseling sessions, Psychoeducation, Medication administration, Evaluate responses to treatment, Monitor vital signs and CBGs as ordered, Perform/monitor CIWA, COWS, AIMS and Fall Risk screenings as ordered, Perform wound care treatments as ordered.  Evaluation of Outcomes: Progressing   LCSW Treatment Plan for Primary Diagnosis: Major depressive disorder, recurrent episode, severe (HCC) Long Term Goal(s): Safe transition to appropriate next level of care at discharge, Engage patient in therapeutic group addressing interpersonal concerns.  Short Term Goals: Engage patient in aftercare planning with referrals and resources, Increase social support, Increase ability to appropriately verbalize feelings, Increase emotional regulation, Facilitate acceptance of mental health diagnosis and concerns, Facilitate patient progression through stages of change regarding substance use diagnoses and concerns, Identify triggers associated with mental health/substance abuse issues, and Increase skills for wellness and recovery  Therapeutic Interventions: Assess for all discharge needs, 1 to 1 time with Social worker, Explore available resources and support systems, Assess for adequacy in community support network, Educate family and significant other(s) on suicide prevention, Complete Psychosocial Assessment, Interpersonal group  therapy.  Evaluation of Outcomes: Progressing   Progress in Treatment: Attending groups: Yes. Participating in groups: Yes. Taking medication as prescribed: Yes. Toleration medication: Yes. Family/Significant other contact made: No, will contact:  CSW will reach out to Launa Grill, friend . Patient understands diagnosis: Yes. Discussing patient identified problems/goals with staff: Yes. Medical problems stabilized or resolved: Yes. Denies suicidal/homicidal ideation: No. Issues/concerns per patient self-inventory: Yes. Other: none   New problem(s) identified: No, Describe:  none  New Short Term/Long Term Goal(s): Patient to work towards medication management for mood stabilization; elimination of SI thoughts; development of comprehensive mental wellness plan.  Patient Goals:  Patient states his goals for treatment are to "just get my depression and anxiety down, . . Get a good game plan moving forward and be more honest with myself."  Discharge Plan or Barriers: No psychosocial barriers identified at this time, patient to return to place of residence when appropriate for discharge.   Reason for Continuation of Hospitalization: Anxiety Depression   Days Estimated Length of Stay: 1-  Last 3 [place] Suicide Severity Risk Score: Flowsheet Row Ad[facility] (Current) [facility] from 04/25/2022 in [facility] ED from [facility]20/2021 in [facility]  C-SSRS RISK CATEGORY Moderate Risk High Risk No Risk      Scribe for Treatment Team: Almedia Balls 04/28/2022 2:13 PM

## 2022-04-28 NOTE — Group Note (Signed)
LCSW Group Therapy Note  Group Date: 04/28/2022 Start Time: 1300 End Time: 1400   Type of Therapy and Topic:  Group Therapy - Healthy vs Unhealthy Coping Skills  Participation Level:  Active   Description of Group The focus of this group was to determine what unhealthy coping techniques typically are used by group members and what healthy coping techniques would be helpful in coping with various problems. Patients were guided in becoming aware of the differences between healthy and unhealthy coping techniques. Patients were asked to identify 2-3 healthy coping skills they would like to learn to use more effectively.  Therapeutic Goals Patients learned that coping is what human beings do all day long to deal with various situations in their lives Patients defined and discussed healthy vs unhealthy coping techniques Patients identified their preferred coping techniques and identified whether these were healthy or unhealthy Patients determined 2-3 healthy coping skills they would like to become more familiar with and use more often. Patients provided support and ideas to each other   Summary of Patient Progress:  During group, the expressed that deep breaths are a coping skill for them. Patient proved open to input from peers and feedback from CSW. Patient demonstrated insight into the subject matter, was respectful of peers, and participated throughout the entire session.   Therapeutic Modalities Cognitive Behavioral Therapy Motivational Interviewing  Aram Beecham, [place] 04/28/2022  1:46 PM

## 2022-04-28 NOTE — Progress Notes (Signed)
   04/28/22 0900  Psych Admission Type (Psych Patients Only)  Admission Status Voluntary  Psychosocial Assessment  Patient Complaints Anxiety;Depression  Eye Contact Fair  Facial Expression Sad  Affect Anxious;Depressed  Speech Logical/coherent  Interaction Assertive  Motor Activity Slow  Appearance/Hygiene Unremarkable  Behavior Characteristics Cooperative;Appropriate to situation  Mood Anxious;Depressed;Pleasant  Aggressive Behavior  Effect No apparent injury  Thought Process  Coherency Circumstantial  Content Blaming self  Delusions None reported or observed  Perception WDL  Hallucination None reported or observed  Judgment Impaired  Confusion None  Danger to Self  Current suicidal ideation? Denies  Danger to Others  Danger to Others None reported or observed

## 2022-04-29 LAB — RETICULOCYTES
Immature Retic Fract: 5.4 % (ref 2.3–15.9)
RBC.: 3.67 MIL/uL — ABNORMAL LOW (ref 4.22–5.81)
Retic Count, Absolute: 36.3 10*3/uL (ref 19.0–186.0)
Retic Ct Pct: 1 % (ref 0.4–3.1)

## 2022-04-29 LAB — IRON AND TIBC
Iron: 140 ug/dL (ref 45–182)
Saturation Ratios: 48 % — ABNORMAL HIGH (ref 17.9–39.5)
TIBC: 290 ug/dL (ref 250–450)
UIBC: 150 ug/dL

## 2022-04-29 LAB — FERRITIN: Ferritin: 171 ng/mL (ref 24–336)

## 2022-04-29 NOTE — Progress Notes (Addendum)
[facility] MD Progress Note  04/29/2022 7:23 AM Christian Andrews  MRN:  [id number] Subjective:    Christian Andrews (Sam) Ketelsen is a 41 year old male with a past psychiatric history of depression voluntarily admitted to [facility] due to passive SI.  Interval History: PRN Medications administered within the last 24 hours: Trazodone, atarax x1 Per nursing staff: appears anxious and depressed but is relatively pleasant, denies SI  Denies SI/HI/AVH.   CSW: OP psychiatrist and therapist  Nervous. Attainable goals.  Per Patient:  On assessment today, patient reports to be doing well.  Patient states that while it took some time for him to fall asleep, he did feel very well rested when he woke up this morning.  Reports appetite is appropriate.  Denies any other acute complaints at this time.  Denies SI/HI/AVH.  Patient denies other side effects to current scheduled psychiatric medications.   Patient denies any other somatic complaints.  Discussed performing anemia work-up for patient's anemia for which patient was amenable to.  Does report previously having melena but denies hemoptysis, hematochezia, severe abdominal pain, constipation, diarrhea.  Discussed plan for discharge tomorrow for which patient was also amenable to.  Reports feeling mildly anxious but has made a list of discrete tasks that he would be doing when he leaves the hospital.   Principal Problem: Major depressive disorder, recurrent episode, severe (HCC) Diagnosis: Principal Problem:   Major depressive disorder, recurrent episode, severe (HCC) Active Problems:   Generalized anxiety disorder   Alcohol use disorder, severe, in early remission (HCC)   Stimulant use disorder  Total Time spent with patient: 20 minutes  Past Psychiatric History:  The patient reports a past history of depression and states that at one time he was diagnosed with borderline personality disorder but he disagrees with this diagnosis.  He reports a brief  psychiatric hospitalization in [facility].  He believes it was a state hospital stay.  He reports that he was only there for 2 days when he was 41 years old.  He reports a past history of 3 overdose attempts in his 20s/early teens and 1 episode of wrist cutting.  He denies psychiatric hospitalizations within the last 20 years.  He reports taking no regularly prescribed psychiatric medications except for an antidepressant that starts with a "T".  The patient believes that it is trazodone after the provider names this medication.  Past Medical History: History reviewed. No pertinent past medical history. History reviewed. No pertinent surgical history. Family History: History reviewed. No pertinent family history. Family History: Completed suicide in his brother, who also suffered from heroin addiction and bipolar affective disorder.  The patient also reports that his mother had bipolar affective disorder.   Social History:  Social History   Substance and Sexual Activity  Alcohol Use Not Currently     Social History   Substance and Sexual Activity  Drug Use Not Currently   Types: Cocaine    Social History   Socioeconomic History   Marital status: Single    Spouse name: Not on file   Number of children: Not on file   Years of education: Not on file   Highest education level: Not on file  Occupational History   Not on file  Tobacco Use   Smoking status: Every Day    Packs/day: 0.75    Types: Cigarettes   Smokeless tobacco: Never  Substance and Sexual Activity   Alcohol use: Not Currently   Drug use: Not Currently    Types: Cocaine  Sexual activity: Yes  Other Topics Concern   Not on file  Social History Narrative   Not on file   Social Determinants of Health   Financial Resource Strain: Not on file  Food Insecurity: Not on file  Transportation Needs: Not on file  Physical Activity: Not on file  Stress: Not on file  Social Connections: Not on file    Additional Social History:      Sleep: Poor  Appetite:  Fair  Current Medications: Current Facility-Administered Medications  Medication Dose Route Frequency Provider Last Rate Last Admin   acetaminophen (TYLENOL) tablet 650 mg  650 mg Oral Q6H PRN Lauree Chandler, NP       alum & mag hydroxide-simeth (MAALOX/MYLANTA) 200-200-20 MG/5ML suspension 30 mL  30 mL Oral Q4H PRN Lauree Chandler, NP       feeding supplement (ENSURE ENLIVE / ENSURE PLUS) liquid 237 mL  237 mL Oral BID BM Massengill, Harrold Donath, MD   237 mL at 04/28/22 1503   hydrOXYzine (ATARAX) tablet 25 mg  25 mg Oral TID PRN Lauree Chandler, NP   25 mg at 04/28/22 0753   magnesium hydroxide (MILK OF MAGNESIA) suspension 30 mL  30 mL Oral Daily PRN Lauree Chandler, NP       mirtazapine (REMERON) tablet 15 mg  15 mg Oral QHS Carlyn Reichert, MD       nicotine (NICODERM CQ - dosed in mg/24 hours) patch 21 mg  21 mg Transdermal Daily Massengill, Harrold Donath, MD   21 mg at 04/28/22 0753   traZODone (DESYREL) tablet 50 mg  50 mg Oral Once Sindy Guadeloupe, NP        Lab Results:  No results found for this or any previous visit (from the past 48 hour(s)).   Blood Alcohol level:  Lab Results  Component Value Date   ETH <10 04/25/2022   ETH <10 10/05/2020    Metabolic Disorder Labs: Lab Results  Component Value Date   HGBA1C 5.7 (H) 04/25/2022   MPG 116.89 04/25/2022   No results found for: "PROLACTIN" Lab Results  Component Value Date   CHOL 183 04/25/2022   TRIG 96 04/25/2022   HDL 55 04/25/2022   CHOLHDL 3.3 04/25/2022   VLDL 19 04/25/2022   LDLCALC 109 (H) 04/25/2022    Physical Findings: AIMS: Facial and Oral Movements Muscles of Facial Expression: None, normal Lips and Perioral Area: None, normal Jaw: None, normal Tongue: None, normal,Extremity Movements Upper (arms, wrists, hands, fingers): None, normal Lower (legs, knees, ankles, toes): None, normal, Trunk Movements Neck, shoulders, hips:  None, normal, Overall Severity Severity of abnormal movements (highest score from questions above): None, normal Incapacitation due to abnormal movements: None, normal Patient's awareness of abnormal movements (rate only patient's report): No Awareness, Dental Status Current problems with teeth and/or dentures?: No Does patient usually wear dentures?: No  CIWA:    COWS:     Musculoskeletal: Strength & Muscle Tone: within normal limits Gait & Station: normal Patient leans: N/A  Psychiatric Specialty Exam:   Presentation  General Appearance: Casual   Eye Contact:Good   Speech:Clear and Coherent; Normal Rate   Speech Volume:Normal   Handedness:Right     Mood and Affect  Mood: "better"  Affect: congruent, appropriate     Thought Process  Thought Processes:Coherent   Duration of Psychotic Symptoms: No data recorded Past Diagnosis of Schizophrenia or Psychoactive disorder: No   Descriptions of Associations:Intact   Orientation:Full (Time, Place and Person)  Thought Content:Logical   Hallucinations:Hallucinations: None   Ideas of Reference:None   Suicidal Thoughts: denies (has denied since admission) Homicidal Thoughts:Homicidal Thoughts: No     Sensorium  Memory:Immediate Good; Recent Good   Judgment: fair   Insight: fair     Executive Functions  Concentration:Good   Attention Span:Good   Recall:Good   Fund of Knowledge:Good   Language:Good     Psychomotor Activity  Psychomotor Activity:Psychomotor Activity: Normal     Assets  Assets:Communication Skills; Desire for Improvement     Sleep  Sleep: poor     Physical Exam Constitutional:      Appearance: the patient is not toxic-appearing.  Pulmonary:     Effort: Pulmonary effort is normal.  Neurological:     General: No focal deficit present.     Mental Status: the patient is alert and oriented to person, place, and time.    Review of Systems  Respiratory:  Negative for shortness  of breath.   Cardiovascular:  Negative for chest pain.  Gastrointestinal:  Negative for abdominal pain, constipation, diarrhea, nausea and vomiting.  Neurological:  Negative for headaches.  Blood pressure 102/74, pulse (!) 104, temperature 97.7 F (36.5 C), temperature source Oral, resp. rate 16, height 6\' 2"  (1.88 m), weight 67.9 kg, SpO2 100 %. Body mass index is 19.23 kg/m.  Diagnoses / Active Problems:    PLAN: Safety and Monitoring:             --  Voluntary admission to inpatient psychiatric unit for safety, stabilization and treatment             -- Daily contact with patient to assess and evaluate symptoms and progress in treatment             -- Patient's case to be discussed in multi-disciplinary team meeting             -- Observation Level : q15 minute checks             -- Vital signs:  q12 hours             -- Precautions: suicide, elopement, and assault   2. Psychiatric Diagnoses and Treatment:  Major depressive disorder, recurrent, severe Generalized anxiety disorder Alcohol use disorder, severe, in early remission Stimulant use disorder, severe, in early remission --Increase Remeron to 15 mg QHS tonight for mood and sleep -Encourage continued cessation of cocaine and alcohol -We will discuss starting naltrexone tomorrow at discharge to help with alcohol cessation                3. Medical Issues Being Addressed:  Tobacco Use Disorder -- Nicotine patch 21mg /24 hours ordered -- Smoking cessation encouraged  Anemia Hgb dropped 4 points in past year -Iron and TIBC -Ferritin -Reticulocyte   4. Discharge Planning:              -- Social work and case management to assist with discharge planning and identification of hospital follow-up needs prior to discharge             -- Estimated LOS: 5-7 days             -- Discharge Concerns: Need to establish a safety plan; Medication compliance and effectiveness             -- Discharge Goals: Return home with  outpatient referrals for mental health follow-up including medication management/psychotherapy             --Patient has declined  residential rehab  -- Plan for discharge tomorrow  Treatment Plan Summary: Daily contact with patient to assess and evaluate symptoms and progress in treatment and Medication management   Park Pope, MD 04/29/2022, 7:23 AM

## 2022-04-29 NOTE — Progress Notes (Signed)
Pt states he slept better last night with the Remeron. Pt denies SI/HI/AVH. Pt requested PRN vistaril for anxiety this a.m. Pt remains safe.

## 2022-04-29 NOTE — [facility] Group Notes (Signed)
[facility] Group Notes:  (Nursing/MHT/Case Management/Adjunct)  Date:  04/29/2022  Time:  10:05 AM  Type of Therapy:   Orientation/ Goals Group  Participation Level:  Active  Participation Quality:  Appropriate  Affect:  Appropriate  Cognitive:  Alert and Appropriate  Insight:  Appropriate  Engagement in Group:  Engaged  Modes of Intervention:  Discussion  Summary of Progress/Problems: pt participated and was engaged.   Anica S Ponzio 04/29/2022, 10:05 AM

## 2022-04-29 NOTE — Progress Notes (Signed)
Adult Psychoeducational Group Note  Date:  04/29/2022 Time:  9:09 PM  Group Topic/Focus:  Wrap-Up Group:   The focus of this group is to help patients review their daily goal of treatment and discuss progress on daily workbooks.  Participation Level:  Active  Participation Quality:  Appropriate and Attentive  Affect:  Appropriate  Cognitive:  Alert and Appropriate  Insight: Appropriate  Engagement in Group:  Engaged  Modes of Intervention:  Discussion  Additional Comments:  Pt states that he has an okay day and stated some concerns he had about his medication. Pt was instructed to communicate with the nurse about these concerns. Pt conveyed that one of his goals was to commit to AA groups and gain more info about his treatment planning.  Vevelyn Pat 04/29/2022, 9:09 PM

## 2022-04-29 NOTE — Group Note (Signed)
LCSW Group Therapy Note  04/29/2022   10:30-11:30am   Topic:  Anger and its Underlying Emotions  Participation Level:  Active  Description of Group:   In this group, patients identified the primary emotions they often have in situations that eventually provoke them to anger.  Focus was placed on how helpful it is to recognize the underlying emotions to anger in order to address these for more permanent resolution.  Emphasis was also on identifying possible replacement thoughts for the automatic thoughts generated in various situations shared by the group.  Therapeutic Goals: Patients will share emotions that commonly incite their anger and how they typically respond Patients will identify how their coping skills work for them and/or against them Patients will explore possible alternative thoughts to their automatic ones Patients will learn that anger itself is normal and that healthier reactions can assist with resolving conflict rather than worsening situations  Summary of Patient Progress:  The patient shared that his frequent precursor emotion to anger is feeling that things are unfair or that he is misunderstood.  He spoke freely and frequently throughout group.  Patient indicated a willingness to try working on the underlying emotions in order to feel better both in the short-term and in the long-run.  Therapeutic Modalities:   Cognitive Behavioral Therapy Processing  Lynnell Chad, MSW, LCSW

## 2022-04-29 NOTE — Progress Notes (Signed)

## 2022-04-30 MED ORDER — HYDROXYZINE HCL 25 MG PO TABS: 25.0000 mg | ORAL_TABLET | Freq: Three times a day (TID) | ORAL | 0 refills | Status: DC | PRN

## 2022-04-30 MED ORDER — MIRTAZAPINE 15 MG PO TABS: 15.0000 mg | ORAL_TABLET | Freq: Every day | ORAL | 0 refills | Status: DC

## 2022-04-30 MED ORDER — NICOTINE 21 MG/24HR TD PT24: 21.0000 mg | MEDICATED_PATCH | Freq: Every day | TRANSDERMAL | 0 refills | Status: DC

## 2022-04-30 NOTE — [facility] Suicide Risk Assessment (Cosign Needed)
[facility] Discharge Suicide Risk Assessment   Principal Problem: Major depressive disorder, recurrent episode, severe (HCC) Discharge Diagnoses: Principal Problem:   Major depressive disorder, recurrent episode, severe (HCC) Active Problems:   Generalized anxiety disorder   Alcohol use disorder, severe, in early remission (HCC)   Stimulant use disorder   Reason for Admission: Christian Andrews is a 41 year old male with a past psychiatric history of depression voluntarily admitted to [facility] due to passive SI.  Hospital Summary Hospital Course:   HOSPITAL COURSE: During the patient's hospitalization, patient had extensive initial psychiatric evaluation, and follow-up psychiatric evaluations every day.   Psychiatric diagnoses provided upon initial assessment: MDD and GAD   Patient's psychiatric medications were adjusted on admission: Patient was initially started on Effexor but due to side effects of insomnia and irritability, patient was transition to Remeron for which he was titrated up to 15 mg nightly.  Patient will occasionally use hydroxyzine for anxiety as well as refractory insomnia.   During the hospitalization, other adjustments were made to the patient's psychiatric medication regimen: None   Patient's care was discussed during the interdisciplinary team meeting every day during the hospitalization.   The patient denies having side effects to prescribed psychiatric medication.   Gradually, patient started adjusting to milieu. The patient was evaluated each day by a clinical provider to ascertain response to treatment. Improvement was noted by the patient's report of decreasing symptoms, improved sleep and appetite, affect, medication tolerance, behavior, and participation in unit programming.  Patient was asked each day to complete a self inventory noting mood, mental status, pain, new symptoms, anxiety and concerns.     Symptoms were reported as significantly decreased or  resolved completely by discharge.    On day of discharge, the patient reports that their mood is stable. The patient denied having suicidal thoughts for more than 48 hours prior to discharge.  Patient denies having homicidal thoughts.  Patient denies having auditory hallucinations.  Patient denies any visual hallucinations or other symptoms of psychosis. The patient was motivated to continue taking medication with a goal of continued improvement in mental health.    The patient reports their target psychiatric symptoms of anxiety, depression, suicidal ideation responded well to the psychiatric medications, and the patient reports overall benefit other psychiatric hospitalization. Supportive psychotherapy was provided to the patient. The patient also participated in regular group therapy while hospitalized. Coping skills, problem solving as well as relaxation therapies were also part of the unit programming.   Labs were reviewed with the patient, and abnormal results were discussed with the patient.   The patient is able to verbalize their individual safety plan to this provider.   # It is recommended to the patient to continue psychiatric medications as prescribed, after discharge from the hospital.     # It is recommended to the patient to follow up with your outpatient psychiatric provider and PCP.   # It was discussed with the patient, the impact of alcohol, drugs, tobacco have been there overall psychiatric and medical wellbeing, and total abstinence from substance use was recommended the patient.ed.   # Prescriptions provided or sent directly to preferred pharmacy at discharge. Patient agreeable to plan. Given opportunity to ask questions. Appears to feel comfortable with discharge.    # In the event of worsening symptoms, the patient is instructed to call the crisis hotline, 911 and or go to the nearest ED for appropriate evaluation and treatment of symptoms. To follow-up with primary care  provider  for other medical issues, concerns and or health care needs   # Patient was discharged home with a plan to follow up as noted below   Total Time spent with patient: 45 minutes  Musculoskeletal: Strength & Muscle Tone: within normal limits Gait & Station: normal Patient leans: N/A  Psychiatric Specialty Exam  Presentation  General Appearance: Casual   Eye Contact:Good   Speech:Clear and Coherent; Normal Rate   Speech Volume:Normal   Handedness:Right    Mood and Affect  Mood:Anxious; Depressed; Hopeless   Duration of Depression Symptoms: Greater than two weeks   Affect:Congruent    Thought Process  Thought Processes:Coherent   Descriptions of Associations:Intact   Orientation:Full (Time, Place and Person)   Thought Content:Logical   History of Schizophrenia/Schizoaffective disorder:No   Duration of Psychotic Symptoms:No data recorded  Hallucinations:No data recorded Ideas of Reference:None   Suicidal Thoughts:No data recorded Homicidal Thoughts:No data recorded  Sensorium  Memory:Immediate Good; Recent Good   Judgment:Poor   Insight:Shallow    Executive Functions  Concentration:Good   Attention Span:Good   Recall:Good   Fund of Knowledge:Good   Language:Good    Psychomotor Activity  Psychomotor Activity:No data recorded  Assets  Assets:Communication Skills; Desire for Improvement    Sleep  Sleep:No data recorded  Physical Exam: Physical Exam ROS Blood pressure 105/79, pulse 93, temperature 97.8 F (36.6 C), temperature source Oral, resp. rate 16, height 6\' 2"  (1.88 m), weight 67.9 kg, SpO2 100 %. Body mass index is 19.23 kg/m.  Mental Status Per Nursing Assessment::   On Admission:  Suicidal ideation indicated by patient  Demographic Factors:  Male and Low socioeconomic status  Loss Factors: Financial problems/change in socioeconomic status  Historical Factors: Family history of suicide,  Family history of mental illness or substance abuse, Anniversary of important loss, and Impulsivity  Risk Reduction Factors:   Positive coping skills or problem solving skills  Continued Clinical Symptoms:  Severe Anxiety and/or Agitation Previous Psychiatric Diagnoses and Treatments  Cognitive Features That Contribute To Risk:  None    Suicide Risk:  Mild:  Suicidal ideation of limited frequency, intensity, duration, and specificity.  There are no identifiable plans, no associated intent, mild dysphoria and related symptoms, good self-control (both objective and subjective assessment), few other risk factors, and identifiable protective factors, including available and accessible social support.   Follow-up Information     [facility]. Go to.   Specialty: Behavioral Health Why: Please go to this provider for an asssessment to obtain therapy and medication management services, on Monday or Wednesdays; arrive by 7:30 am.  Services are provided on a first come, first served basis. Contact information: [address] [place] [phone]                Plan Of Care/Follow-up recommendations:  Activity: as tolerated  Diet: heart healthy  Other: Activity: as tolerated   Diet: heart healthy   Other: -Follow-up with your outpatient psychiatric provider -instructions on appointment date, time, and address (location) are provided to you in discharge paperwork.   -Take your psychiatric medications as prescribed at discharge - instructions are provided to you in the discharge paperwork   -Testing: Follow-up with outpatient provider for abnormal lab results: CBC for anemia   -Recommend abstinence from alcohol, tobacco, and other illicit drug use at discharge.    -If your psychiatric symptoms recur, worsen, or if you have side effects to your psychiatric medications, call your outpatient psychiatric provider, 911, 988  or go to  the nearest emergency department.   -If suicidal thoughts recur, call your outpatient psychiatric provider, 911, 988 or go to the nearest emergency department.   Park Pope, MD 04/30/2022, 7:37 AM

## 2022-04-30 NOTE — Progress Notes (Signed)
Patient ID: Christian Andrews, male   DOB: 1981/01/20, 41 y.o.   MRN: [id number] Nursing discharge note: Patient discharged home per MD order.  Patient received all personal belongings from unit and locker.  Reviewed AVS/transition record with patient and he indicates understanding.  Patient will follow up with [facility].  Patient denies any thoughts of self harm.  He left ambulatory with a friend.

## 2022-04-30 NOTE — Discharge Summary (Addendum)
Physician Discharge Summary Note  Patient:  Christian Andrews is an 41 y.o., male MRN:  [id number] DOB:  18-Apr-1981 Patient phone:  [phone] (home)  Patient address:   [address] [place] [address],  Total Time spent with patient: 1 hour  Date of Admission:  04/26/2022 Date of Discharge: 04/30/22  Reason for Admission:  Christian Andrews (Christian) Andrews is a 41 year old male with a past psychiatric history of depression voluntarily admitted to [facility] due to passive SI.  Principal Problem: Major depressive disorder, recurrent episode, severe (HCC) Discharge Diagnoses: Principal Problem:   Major depressive disorder, recurrent episode, severe (HCC) Active Problems:   Generalized anxiety disorder   Alcohol use disorder, severe, in early remission (HCC)   Stimulant use disorder    Past Psychiatric History:  The patient reports a past history of depression and states that at one time he was diagnosed with borderline personality disorder but he disagrees with this diagnosis.  He reports a brief psychiatric hospitalization in [facility].  He believes it was a state hospital stay.  He reports that he was only there for 2 days when he was 41 years old.  He reports a past history of 3 overdose attempts in his 20s/early teens and 1 episode of wrist cutting.  He denies psychiatric hospitalizations within the last 20 years.  He reports taking no regularly prescribed psychiatric medications except for an antidepressant that starts with a "T".  The patient believes that it is trazodone after the provider names this medication.  Past Medical History: History reviewed. No pertinent past medical history. History reviewed. No pertinent surgical history. Family History: History reviewed. No pertinent family history. Family Psychiatric  History:  Completed suicide in his brother, who also suffered from heroin addiction and bipolar affective disorder.  The patient also reports that his mother had  bipolar affective disorder.  Social History:  Social History   Substance and Sexual Activity  Alcohol Use Not Currently     Social History   Substance and Sexual Activity  Drug Use Not Currently   Types: Cocaine    Social History   Socioeconomic History   Marital status: Single    Spouse name: Not on file   Number of children: Not on file   Years of education: Not on file   Highest education level: Not on file  Occupational History   Not on file  Tobacco Use   Smoking status: Every Day    Packs/day: 0.75    Types: Cigarettes   Smokeless tobacco: Never  Substance and Sexual Activity   Alcohol use: Not Currently   Drug use: Not Currently    Types: Cocaine   Sexual activity: Yes  Other Topics Concern   Not on file  Social History Narrative   Not on file   Social Determinants of Health   Financial Resource Strain: Not on file  Food Insecurity: Not on file  Transportation Needs: Not on file  Physical Activity: Not on file  Stress: Not on file  Social Connections: Not on file    Hospital Course:   HOSPITAL COURSE: During the patient's hospitalization, patient had extensive initial psychiatric evaluation, and follow-up psychiatric evaluations every day.  Psychiatric diagnoses provided upon initial assessment: MDD and GAD, nicotine use disorder  Patient's psychiatric medications were adjusted on admission: Patient was initially started on Effexor but due to side effects of insomnia and irritability, patient was transition to Remeron for which he was titrated up to 15 mg nightly.  Patient  will occasionally use hydroxyzine for anxiety as well as refractory insomnia.  During the hospitalization, other adjustments were made to the patient's psychiatric medication regimen: None  Patient's care was discussed during the interdisciplinary team meeting every day during the hospitalization.  The patient denies having side effects to prescribed psychiatric  medication.  Gradually, patient started adjusting to milieu. The patient was evaluated each day by a clinical provider to ascertain response to treatment. Improvement was noted by the patient's report of decreasing symptoms, improved sleep and appetite, affect, medication tolerance, behavior, and participation in unit programming.  Patient was asked each day to complete a self inventory noting mood, mental status, pain, new symptoms, anxiety and concerns.    Symptoms were reported as significantly decreased or resolved completely by discharge.   On day of discharge, the patient reports that their mood is stable. The patient denied having suicidal thoughts for more than 48 hours prior to discharge.  Patient denies having homicidal thoughts.  Patient denies having auditory hallucinations.  Patient denies any visual hallucinations or other symptoms of psychosis. The patient was motivated to continue taking medication with a goal of continued improvement in mental health.   The patient reports their target psychiatric symptoms of anxiety, depression, suicidal ideation responded well to the psychiatric medications, and the patient reports overall benefit other psychiatric hospitalization. Supportive psychotherapy was provided to the patient. The patient also participated in regular group therapy while hospitalized. Coping skills, problem solving as well as relaxation therapies were also part of the unit programming.  Labs were reviewed with the patient, and abnormal results were discussed with the patient.  The patient is able to verbalize their individual safety plan to this provider.  # It is recommended to the patient to continue psychiatric medications as prescribed, after discharge from the hospital.    # It is recommended to the patient to follow up with your outpatient psychiatric provider and PCP.  # It was discussed with the patient, the impact of alcohol, drugs, tobacco have been there overall  psychiatric and medical wellbeing, and total abstinence from substance use was recommended the patient.ed.  # Prescriptions provided or sent directly to preferred pharmacy at discharge. Patient agreeable to plan. Given opportunity to ask questions. Appears to feel comfortable with discharge.    # In the event of worsening symptoms, the patient is instructed to call the crisis hotline, 911 and or go to the nearest ED for appropriate evaluation and treatment of symptoms. To follow-up with primary care provider for other medical issues, concerns and or health care needs  # Patient was discharged home with a plan to follow up as noted below.  Physical Findings: AIMS:  Facial and Oral Movements Muscles of Facial Expression: None, normal Lips and Perioral Area: None, normal Jaw: None, normal Tongue: None, normal, Extremity Movements Upper (arms, wrists, hands, fingers): None, normal Lower (legs, knees, ankles, toes): None, normal,  Trunk Movements Neck, shoulders, hips: None, normal,  Overall Severity Severity of abnormal movements (highest score from questions above): None, normal Incapacitation due to abnormal movements: None, normal Patient's awareness of abnormal movements (rate only patient's report): No Awareness,  Dental Status Current problems with teeth and/or dentures?: No Does patient usually wear dentures?: No    Musculoskeletal: Strength & Muscle Tone: within normal limits Gait & Station: normal Patient leans: N/A   Psychiatric Specialty Exam:  Presentation  General Appearance: Casual   Eye Contact:Good   Speech:Clear and Coherent; Normal Rate   Speech Volume:Normal  Handedness:Right    Mood and Affect  Mood:Anxious; Depressed; Hopeless   Affect:Congruent    Thought Process  Thought Processes:Coherent   Descriptions of Associations:Intact   Orientation:Full (Time, Place and Person)   Thought Content:Logical   History of  Schizophrenia/Schizoaffective disorder:No   Duration of Psychotic Symptoms:No data recorded  Hallucinations:No data recorded  Ideas of Reference:None   Suicidal Thoughts:No data recorded  Homicidal Thoughts:No data recorded   Sensorium  Memory:Immediate Good; Recent Good   Judgment:Poor   Insight:Shallow    Executive Functions  Concentration:Good   Attention Span:Good   Recall:Good   Fund of Knowledge:Good   Language:Good    Psychomotor Activity  Psychomotor Activity:No data recorded   Assets  Assets:Communication Skills; Desire for Improvement    Sleep  Sleep:No data recorded    Physical Exam: Physical Exam Vitals and nursing note reviewed.  Constitutional:      Appearance: Normal appearance. He is normal weight.  HENT:     Head: Normocephalic and atraumatic.  Pulmonary:     Effort: Pulmonary effort is normal.  Neurological:     General: No focal deficit present.     Mental Status: He is oriented to person, place, and time.    Review of Systems  Respiratory:  Negative for shortness of breath.   Cardiovascular:  Negative for chest pain.  Gastrointestinal:  Negative for abdominal pain, constipation, diarrhea, heartburn, nausea and vomiting.  Neurological:  Negative for headaches.  Psychiatric/Behavioral:  Negative for depression, hallucinations, memory loss, substance abuse and suicidal ideas. The patient is nervous/anxious. The patient does not have insomnia.    Blood pressure 105/79, pulse 93, temperature 97.8 F (36.6 C), temperature source Oral, resp. rate 16, height 6\' 2"  (1.88 m), weight 67.9 kg, SpO2 100 %. Body mass index is 19.23 kg/m.   Social History   Tobacco Use  Smoking Status Every Day   Packs/day: 0.75   Types: Cigarettes  Smokeless Tobacco Never   Tobacco Cessation:  A prescription for an FDA-approved tobacco cessation medication provided at discharge   Blood Alcohol level:  Lab Results  Component Value  Date   [facility] <10 04/25/2022   ETH <10 10/05/2020    Metabolic Disorder Labs:  Lab Results  Component Value Date   HGBA1C 5.7 (H) 04/25/2022   MPG 116.89 04/25/2022   No results found for: "PROLACTIN" Lab Results  Component Value Date   CHOL 183 04/25/2022   TRIG 96 04/25/2022   HDL 55 04/25/2022   CHOLHDL 3.3 04/25/2022   VLDL 19 04/25/2022   LDLCALC 109 (H) 04/25/2022    See Psychiatric Specialty Exam and Suicide Risk Assessment completed by Attending Physician prior to discharge.  Discharge destination:  Home  Is patient on multiple antipsychotic therapies at discharge:  No   Has Patient had three or more failed trials of antipsychotic monotherapy by history:  No  Recommended Plan for Multiple Antipsychotic Therapies: NA   Allergies as of 04/30/2022   No Known Allergies      Medication List     TAKE these medications      Indication  CORTIZONE-10 EX Apply 1 Application topically daily as needed (For rash).  Indication: An Allergic Reaction of the Skin   hydrOXYzine 25 MG tablet Commonly known as: ATARAX Take 1 tablet (25 mg total) by mouth 3 (three) times daily as needed for anxiety.  Indication: Feeling Anxious   mirtazapine 15 MG tablet Commonly known as: REMERON Take 1 tablet (15 mg total) by mouth at bedtime.  Indication: Major Depressive Disorder   nicotine 21 mg/24hr patch Commonly known as: NICODERM CQ - dosed in mg/24 hours Place 1 patch (21 mg total) onto the skin daily. Start taking on: May 01, 2022  Indication: Nicotine Addiction        Follow-up Information     [facility]. Go to.   Specialty: Behavioral Health Why: Please go to this provider for an asssessment to obtain therapy and medication management services, on Monday or Wednesdays; arrive by 7:30 am.  Services are provided on a first come, first served basis. Contact information: [address] [place] [address] [phone]                 Follow-up recommendations:  Activity: as tolerated  Diet: heart healthy  Other: -Follow-up with your outpatient psychiatric provider -instructions on appointment date, time, and address (location) are provided to you in discharge paperwork.  -Take your psychiatric medications as prescribed at discharge - instructions are provided to you in the discharge paperwork  -Testing: Follow-up with outpatient provider for abnormal lab results: CBC for anemia  -Recommend abstinence from alcohol, tobacco, and other illicit drug use at discharge.   -If your psychiatric symptoms recur, worsen, or if you have side effects to your psychiatric medications, call your outpatient psychiatric provider, 911, 988 or go to the nearest emergency department.  -If suicidal thoughts recur, call your outpatient psychiatric provider, 911, 988 or go to the nearest emergency department.  Signed: Park Pope, MD 04/30/2022, 9:38 AM

## 2022-04-30 NOTE — Progress Notes (Signed)
Pt denies SI/HI/AVH.  Pt reports that he is ready for discharge today.  Pt took medications without incident and no adverse effects were noted.  Pt states he is thankful for care he has received while being at [facility]: Lambert Rhonda W.  RN will continue to monitor pt's progress and assist with pt's discharge.

## 2022-04-30 NOTE — Plan of Care (Signed)
Patient mostly in bed and did not participate in evening activities. Presented to the medication room with flat affect but denying SI/HI/AVH.  Received his sleep medication and went to bed. Pt returned to the med window complaining  of anxiety and received Vistaril 25. Currently in bed asleep. No sign of distress noted. Safety monitored as recommended.

## 2022-04-30 NOTE — Group Note (Signed)
Date:  04/30/2022 Time:  4:47 PM  Group Topic/Focus:  Crisis Planning:   The purpose of this group is to help patients create a crisis plan for use upon discharge or in the future, as needed.    Participation Level:  Active  Participation Quality:  Appropriate  Affect:  Appropriate  Cognitive:  Appropriate  Insight: Appropriate  Engagement in Group:  Engaged  Modes of Intervention:  Education Educated patient in ways to self- sooth himself to allow himself to calm down . Explained to patient that by recognizing what he is seeing, hearing and smell are key area to calming himself .down. Reymundo Poll 04/30/2022, 4:47 PM

## 2022-04-30 NOTE — Group Note (Signed)
[facility] LCSW Group Therapy Note  Date/Time:  04/30/2022 10:00am-11:00am  Type of Therapy and Topic:  Group Therapy:  Healthy and Unhealthy Supports  Participation Level:  Did Not Attend   Description of Group:  Patients in this group were introduced to the idea of adding a variety of healthy supports to address the various needs in their lives.  Patients discussed what additional healthy supports could be helpful in their recovery and wellness after discharge in order to prevent future hospitalizations such as counselor, doctor, other levels of psychiatric care such as ACTT services, therapy groups, 12-step groups, and problem-specific support groups.  A demonstration was given about how to set boundaries which patients expressed was beneficial.  Several songs were played to inspire patients to be more self-supportive.  Therapeutic Goals:   1)  discuss importance of adding supports to stay well once out of the hospital  2)  compare healthy versus unhealthy supports and identify some examples of each  3)  generate ideas and descriptions of healthy supports that can be added  4)  offer mutual support about how to address unhealthy supports  5)  encourage active participation in and adherence to discharge plan    Summary of Patient Progress:  The patient was invited to group, did not attend.   Therapeutic Modalities:   Motivational Interviewing Brief Solution-Focused Therapy  [place] Nearhood Grossman-Orr, LCSW       

## 2022-04-30 NOTE — [facility] Suicide Risk Assessment (Signed)
[facility] INPATIENT:  Family/Significant Other Suicide Prevention Education  Suicide Prevention Education:  Contact Attempts: Burhan Barham [phone] (work) or [phone] (Home), (name of family member/significant other) has been identified by the patient as the family member/significant other with whom the patient will be residing, and identified as the person(s) who will aid the patient in the event of a mental health crisis.  With written consent from the patient, two attempts were made to provide suicide prevention education, prior to and/or following the patient's discharge.  We were unsuccessful in providing suicide prevention education.  A suicide education pamphlet was given to the patient to share with family/significant other.  Date and time of first attempt:04/28/2022  / 10:14am Date and time of second attempt:  04/30/2022  / 9:19am No Voicemail available  Lynnell Chad 04/30/2022, 9:20 AM

## 2022-04-30 NOTE — Hospital Course (Signed)
Mood: still anxious Eating: appropriate Case worker:

## 2022-04-30 NOTE — Progress Notes (Signed)
  [facility] Adult Case Management Discharge Plan :  Will you be returning to the same living situation after discharge:  Yes,  alone At discharge, do you have transportation home?: Yes,  patient arranged Do you have the ability to pay for your medications: No.  Will need help from community agency  Release of information consent forms completed and emailed to Medical Records, then turned in to Medical Records by CSW.   Patient to Follow up at:  Follow-up Information     [facility]. Go to.   Specialty: Behavioral Health Why: Please go to this provider for an asssessment to obtain therapy and medication management services, on Monday or Wednesdays; arrive by 7:30 am.  Services are provided on a first come, first served basis. Contact information: [phone] [place] [address] [phone]                Next level of care provider has access to [facility] Link:yes  Safety Planning and Suicide Prevention discussed: No.  Attempts were made     Has patient been referred to the Quitline?: Patient refused referral  Patient has been referred for addiction treatment: Yes  Lynnell Chad, LCSW 04/30/2022, 9:08 AM

## 2022-04-30 NOTE — Discharge Instructions (Addendum)
Dear Christian Andrews,  It was a pleasure taking care of you while you were in the hospital. You were admitted due to worsening depression and suicidal ideation.   Take all medications as prescribed. Report any adverse effects and or reactions from the medicines to your outpatient provider promptly. Do not engage in alcohol and or illegal drug use while on prescription medicines. In the event of worsening symptoms, call the crisis hotline (988), 911 and or go to the nearest ED for appropriate evaluation and treatment of symptoms. follow-up with your primary care provider for your other medical issues, concerns and or health care needs.  Take care! -Dr. Hazle Quant

## 2022-05-09 ENCOUNTER — Encounter (HOSPITAL_COMMUNITY): Payer: Self-pay

## 2022-05-09 ENCOUNTER — Ambulatory Visit (INDEPENDENT_AMBULATORY_CARE_PROVIDER_SITE_OTHER): Payer: No Payment, Other | Admitting: Student in an Organized Health Care Education/Training Program

## 2022-05-09 ENCOUNTER — Encounter (HOSPITAL_COMMUNITY): Payer: Self-pay | Admitting: Student in an Organized Health Care Education/Training Program

## 2022-05-09 VITALS — BP 101/69 | HR 74 | Ht 74.0 in | Wt 161.4 lb

## 2022-05-09 DIAGNOSIS — F411 Generalized anxiety disorder: Secondary | ICD-10-CM

## 2022-05-09 DIAGNOSIS — F332 Major depressive disorder, recurrent severe without psychotic features: Secondary | ICD-10-CM

## 2022-05-09 DIAGNOSIS — F4310 Post-traumatic stress disorder, unspecified: Secondary | ICD-10-CM

## 2022-05-09 DIAGNOSIS — F1021 Alcohol dependence, in remission: Secondary | ICD-10-CM | POA: Diagnosis not present

## 2022-05-09 MED ORDER — FLUOXETINE HCL 10 MG PO CAPS: 10.0000 mg | ORAL_CAPSULE | Freq: Every day | ORAL | 0 refills | Status: DC

## 2022-05-09 MED ORDER — QUETIAPINE FUMARATE 50 MG PO TABS: 50.0000 mg | ORAL_TABLET | Freq: Every day | ORAL | 1 refills | Status: DC

## 2022-05-09 NOTE — Plan of Care (Addendum)
  Problem: Depression CCP Problem  1  Goal: LTG: Alaa WILL SCORE LESS THAN 10 ON THE PATIENT HEALTH QUESTIONNAIRE (PHQ-9) Outcome: Not Applicable Goal: STG: Quame WILL PARTICIPATE IN AT LEAST 80% OF SCHEDULED INDIVIDUAL PSYCHOTHERAPY SESSIONS Outcome: Not Applicable Goal: STG: Reduce overall depression score by a minimum of 25% on the Patient Health Questionnaire (PHQ-9) or the Montgomery-Asberg Depression Rating Scale (MADRS) Outcome: Not Applicable   Problem: Depression CCP Problem  1  Intervention: REVIEW PLEASANT ACTIVITIES LIST WITH Roddie AND SELECT 3 ACTIVITIES TO PRACTICE WEEKLY FOR THE NEXT 4 WEEKS

## 2022-05-09 NOTE — Plan of Care (Signed)
  Problem: Depression CCP Problem  1  Goal: LTG: Caden WILL SCORE LESS THAN 10 ON THE PATIENT HEALTH QUESTIONNAIRE (PHQ-9) Outcome: Not Applicable Goal: STG: Nicolae WILL PARTICIPATE IN AT LEAST 80% OF SCHEDULED INDIVIDUAL PSYCHOTHERAPY SESSIONS Outcome: Not Applicable Goal: STG: Reduce overall depression score by a minimum of 25% on the Patient Health Questionnaire (PHQ-9) or the Montgomery-Asberg Depression Rating Scale (MADRS) Outcome: Not Applicable

## 2022-05-09 NOTE — Progress Notes (Signed)
Psychiatric Initial Adult Assessment   Patient Identification: Christian Andrews MRN:  [id number] Date of Evaluation:  05/09/2022 Referral Source: [facility] Follow Up Chief Complaint:  No chief complaint on file.  Visit Diagnosis:    ICD-10-CM   1. Severe episode of recurrent major depressive disorder, without psychotic features (HCC)  F33.2 FLUoxetine (PROZAC) 10 MG capsule    QUEtiapine (SEROQUEL) 50 MG tablet    2. Generalized anxiety disorder  F41.1 FLUoxetine (PROZAC) 10 MG capsule    QUEtiapine (SEROQUEL) 50 MG tablet    3. Alcohol use disorder, severe, in early remission (HCC)  F10.21     4. PTSD (post-traumatic stress disorder)  F43.10 FLUoxetine (PROZAC) 10 MG capsule    QUEtiapine (SEROQUEL) 50 MG tablet      History of Present Illness:   Christian Andrews is a 41 yr old male who presents to establish care and for medication management.  PPHx is significant for Depression, Anxiety, PTSD, and Polysubstance Abuse (EtOH and Cocaine), 3 Suicide Attempts (OD x 2 and cut wrist in 01/27/2023), no history of Self Injurious Behavior, and 2 prior hospitalizations ([facility]age 61 and [facility] 04/2022).  He reports that his symptoms began earlier this year.  He states he could no longer look in the mirror and was not where he wanted to be in life given his age.  He states he had feelings of hopelessness and thoughts of SI.  He states that he had been heavily drinking and using cocaine.  He reports he quit cold [place] 60 days ago.  He reports he has been working at [employer] for 9 years but took a leave of absence from work and did not go back.  He reports he is trying to make a new start.  He reports that April 13, 2022was 1 things got a lot worse.  He reports that his mother who had COPD had been in and out of the hospital for several years and he had been the caretaker for her for about a year and a half.  He reports that in 27-Jan-2023 she passed away.  He reports that 3 days later his brother completed  suicide and he reports he thinks it was because his brother did not want but that on their mother and so that was why he waited until after she had passed.  He states that his parents got divorced when he was around age 71 so for most of his life his family unit consisted of him and his mother and brother and in a short period of time he lost that.  He states an additional stressor is that his landlord is currently contemplating selling the place that he is staying at and so he may be homeless soon.  He reports he is tired of being tired and wants to make a change.  He reports that the Remeron he was started on when he was hospitalized has helped with his appetite but has not helped with his mood nor his sleep.  He reports past psychiatric history significant for depression, anxiety, and PTSD.  He reports 3 prior suicide attempts-2 overdoses and 1 attempt of cutting his wrist.  He reports that these were done in his 60s so has been several years since that.  He reports no history of self-injurious behavior.  He reports that during his hospitalization he was on Lexapro and Effexor for a few days but had no effect.  He reports that when he was placed on trazodone and it made him  very jittery so that was stopped.  He also reports he was on hydroxyzine but that this did not help his symptoms.  He reports 2 past hospitalizations-the most recent [facility] 2 weeks ago and also hospitalization at [facility] when he was 27 after one of his suicide attempts.  He reports he was only there due to insurance issues and only stayed for about 2 days.  He reports a family history significant for bipolar disorder in his mother and brother.  His brother also had issues with polysubstance abuse but mainly used heroin.  His brother also completed suicide April 2022.  He reports no significant past medical history.  He reports past surgical history significant for tonsillectomy and uvula removal when he was a child as well as having eustachian  tubes placed 4 or 5 times.  He reports no history of head trauma.  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  He reports NKDA.  Currently lives in a rental house.  Given his landlord is contemplating selling he is currently looking at alternatives specifically [facility].  Also discussed SLA with him and how it is similar to [place] [facility] and may be an option for him.  He has been sober from alcohol for 60 days but prior to this had been drinking approximately 8 beers a day.  He reports no illicit substance use for 60 days but prior had been using 1/4 g of cocaine a day and occasional ecstasy.  He reports smoking 1/2 pack/day of cigarettes.  He reports no legal issues.  He reports no access to firearms.  He reports no history of abuse.  Discussed with him that since the Remeron is not working we should discontinue it which he was agreeable to.  Discussed trialing a combination of 2 medications.  Discussed Prozac as this would help with his depression and anxiety as well as his PTSD and issues with lack of motivation.  Also discussed starting Seroquel as this could augment the effect of the Prozac while also helping with his sleep.  Discussed potential risks and side effects of medications and he is agreeable to trialing both of them.  He is also interested in starting therapy.  Discussed starting therapy and he was agreeable to it.  He will return next week for therapy.  He reports no SI, HI, or AVH.  Discussed with him what to do in the event of a future crisis.  Discussed that he can return to [facility], go to [facility], go to the nearest ED, or call 911 or [phone].   He reported understanding and had no concerns.    Associated Signs/Symptoms: Depression Symptoms:  depressed mood, anhedonia, fatigue, feelings of worthlessness/guilt, difficulty concentrating, hopelessness, impaired memory, suicidal thoughts with specific plan, anxiety, loss of energy/fatigue, disturbed  sleep, decreased appetite, (Hypo) Manic Symptoms:   Reports None Anxiety Symptoms:  Excessive Worry, Panic Symptoms, Psychotic Symptoms:   Reports None PTSD Symptoms: Re-experiencing:  Flashbacks Intrusive Thoughts Nightmares Hypervigilance:  Yes Hyperarousal:  Emotional Numbness/Detachment Increased Startle Response Avoidance:  Decreased Interest/Participation  Past Psychiatric History: Depression, Anxiety, PTSD, and Polysubstance Abuse (EtOH and Cocaine), 3 Suicide Attempts (OD x 2 and cut wrist in 20's), no history of Self Injurious Behavior, and 2 prior hospitalizations ([facility]age 64 and [facility] 04/20[facility]).  Previous Psychotropic Medications: Yes   Substance Abuse History in the last 12 months:  Yes.    Consequences of Substance Abuse: NA  Past Medical History: History reviewed. No pertinent past  medical history.  Past Surgical History:  Procedure Laterality Date   TONSILECTOMY/ADENOIDECTOMY WITH MYRINGOTOMY Bilateral 1997    Family Psychiatric History: Mother- Bipolar Disorder, possible suicide attempt Brother- Bipolar Disorder, Heroin and polysubstance abuse, Suicide 01/2021.  Family History: History reviewed. No pertinent family history.  Social History:   Social History   Socioeconomic History   Marital status: Single    Spouse name: Not on file   Number of children: Not on file   Years of education: Not on file   Highest education level: Not on file  Occupational History   Not on file  Tobacco Use   Smoking status: Every Day    Packs/day: 0.75    Types: Cigarettes   Smokeless tobacco: Never  Substance and Sexual Activity   Alcohol use: Not Currently   Drug use: Not Currently    Types: Cocaine   Sexual activity: Yes  Other Topics Concern   Not on file  Social History Narrative   Not on file   Social Determinants of Health   Financial Resource Strain: Not on file  Food Insecurity: Not on file  Transportation Needs: Not on file  Physical Activity: Not  on file  Stress: Not on file  Social Connections: Not on file    Additional Social History: None  Allergies:  No Known Allergies  Metabolic Disorder Labs: Lab Results  Component Value Date   HGBA1C 5.7 (H) 04/25/2022   MPG 116.89 04/25/2022   No results found for: "PROLACTIN" Lab Results  Component Value Date   CHOL 183 04/25/2022   TRIG 96 04/25/2022   HDL 55 04/25/2022   CHOLHDL 3.3 04/25/2022   VLDL 19 04/25/2022   LDLCALC 109 (H) 04/25/2022   Lab Results  Component Value Date   TSH 1.527 04/25/2022    Therapeutic Level Labs: No results found for: "LITHIUM" No results found for: "CBMZ" No results found for: "VALPROATE"  Current Medications: Current Outpatient Medications  Medication Sig Dispense Refill   FLUoxetine (PROZAC) 10 MG capsule Take 1 capsule (10 mg total) by mouth daily. 30 capsule 0   QUEtiapine (SEROQUEL) 50 MG tablet Take 1 tablet (50 mg total) by mouth at bedtime. 30 tablet 1   Hydrocortisone (CORTIZONE-10 EX) Apply 1 Application topically daily as needed (For rash).     nicotine (NICODERM CQ - DOSED IN MG/24 HOURS) 21 mg/24hr patch Place 1 patch (21 mg total) onto the skin daily. 28 patch 0   No current facility-administered medications for this visit.    Musculoskeletal: Strength & Muscle Tone: within normal limits Gait & Station: normal Patient leans: N/A  Psychiatric Specialty Exam: Review of Systems  Respiratory:  Negative for shortness of breath.   Cardiovascular:  Negative for chest pain.  Gastrointestinal:  Negative for abdominal pain, constipation, diarrhea, nausea and vomiting.  Neurological:  Negative for dizziness, weakness and headaches.  Psychiatric/Behavioral:  Positive for decreased concentration, dysphoric mood and sleep disturbance. Negative for hallucinations, self-injury and suicidal ideas. The patient is nervous/anxious. The patient is not hyperactive.     Blood pressure 101/69, pulse 74, height 6\' 2"  (1.88 m), weight  161 lb 6.4 oz (73.2 kg), SpO2 99 %.Body mass index is 20.72 kg/m.  General Appearance: Casual, unshaven  Eye Contact:  Good  Speech:  Clear and Coherent and Normal Rate  Volume:  Normal  Mood:  Depressed  Affect:  Depressed  Thought Process:  Coherent and Goal Directed  Orientation:  Full (Time, Place, and Person)  Thought Content:  Logical  Suicidal Thoughts:  No  Homicidal Thoughts:  No  Memory:  Immediate;   Fair Recent;   Fair  Judgement:  Fair  Insight:  Fair  Psychomotor Activity:  Normal  Concentration:  Concentration: Fair and Attention Span: Fair  Recall:  [employer] of Knowledge:Good  Language: Good  Akathisia:  Negative  Handed:  Right  AIMS (if indicated):  not done  Assets:  Communication Skills Desire for Improvement Physical Health Resilience  ADL's:  Intact  Cognition: WNL  Sleep:  Poor   Screenings: AIMS    Flowsheet Row [facility]  AIMS Total Score 0      AUDIT    Flowsheet Row [facility]  Alcohol Use Disorder Identification Test Final Score (AUDIT) 0      GAD-7    Flowsheet Row Office Visit from 05/09/2022 in [facility]  Total GAD-7 Score 16      PHQ2-9    Flowsheet Row Office Visit from 05/09/2022 in [place]  PHQ-2 Total Score 6  PHQ-9 Total Score 21      Flowsheet Row [facility] from 04/25/2022 in [facility] ED from [facility]20/2021 in [facility]  C-SSRS RISK CATEGORY Moderate Risk High Risk No Risk       Assessment and Plan:  Christian Andrews is a 41 yr old male who presents to establish care and for medication management.  PPHx is significant for Depression, Anxiety, PTSD, and  Polysubstance Abuse (EtOH and Cocaine), 3 Suicide Attempts (OD x 2 and cut wrist in 20's), no history of Self Injurious Behavior, and 2 prior hospitalizations ([facility]age 59 and [facility] 04/20[facility]).   "Sam" meets criteria for MDD and GAD based on his symptoms and his PHQ-9 score of 21 and GAD-7 score of 16.  He also meets criteria for PTSD given the trauma of the passing of his mother and brother.  Given his symptoms have not improved with Remeron we will stop this at this time.  We will replace this with Prozac as this will help improve his motivation.  Given his issues with sleep and the severity of his symptoms we will also start Seroquel.   MDD, Recurrent, Severe w/out psychosis  GAD  PTSD: -Stop Remeron -Stop Atarax -Start Prozac 10 mg daily for depression and anxiety.  30 tablets with 0 refills -Start Seroquel 50 mg QHS for Augmentation and Insomnia.  30 tablets with 1 refill.    Collaboration of Care:   Patient/Guardian was advised Release of Information must be obtained prior to any record release in order to collaborate their care with an outside provider. Patient/Guardian was advised if they have not already done so to contact the registration department to sign all necessary forms in order for [place] to release information regarding their care.   Consent: Patient/Guardian gives verbal consent for treatment and assignment of benefits for services provided during this visit. Patient/Guardian expressed understanding and agreed to proceed.   Lauro Franklin, MD 7/25/202312:23 PM

## 2022-05-16 ENCOUNTER — Ambulatory Visit (HOSPITAL_COMMUNITY)
Payer: Federal, State, Local not specified - Other | Admitting: Student in an Organized Health Care Education/Training Program

## 2022-05-25 ENCOUNTER — Ambulatory Visit (HOSPITAL_COMMUNITY)
Payer: Federal, State, Local not specified - Other | Admitting: Student in an Organized Health Care Education/Training Program

## 2022-11-02 ENCOUNTER — Other Ambulatory Visit: Payer: Self-pay

## 2022-11-02 ENCOUNTER — Ambulatory Visit (HOSPITAL_COMMUNITY)
Admission: EM | Admit: 2022-11-02 | Discharge: 2022-11-03 | Disposition: A | Discharge status: Other Acute Inpatient Hospital | Payer: No Payment, Other | Attending: Family | Admitting: Family

## 2022-11-02 DIAGNOSIS — Z79899 Other long term (current) drug therapy: Secondary | ICD-10-CM | POA: Insufficient documentation

## 2022-11-02 DIAGNOSIS — R45851 Suicidal ideations: Secondary | ICD-10-CM | POA: Insufficient documentation

## 2022-11-02 DIAGNOSIS — Z1152 Encounter for screening for COVID-19: Secondary | ICD-10-CM | POA: Insufficient documentation

## 2022-11-02 DIAGNOSIS — Z556 Problems related to health literacy: Secondary | ICD-10-CM | POA: Insufficient documentation

## 2022-11-02 DIAGNOSIS — F17210 Nicotine dependence, cigarettes, uncomplicated: Secondary | ICD-10-CM | POA: Insufficient documentation

## 2022-11-02 DIAGNOSIS — Z9151 Personal history of suicidal behavior: Secondary | ICD-10-CM | POA: Insufficient documentation

## 2022-11-02 DIAGNOSIS — F332 Major depressive disorder, recurrent severe without psychotic features: Secondary | ICD-10-CM | POA: Diagnosis not present

## 2022-11-02 DIAGNOSIS — F1021 Alcohol dependence, in remission: Secondary | ICD-10-CM | POA: Diagnosis not present

## 2022-11-02 DIAGNOSIS — F1490 Cocaine use, unspecified, uncomplicated: Secondary | ICD-10-CM | POA: Insufficient documentation

## 2022-11-02 DIAGNOSIS — Z5982 Transportation insecurity: Secondary | ICD-10-CM | POA: Insufficient documentation

## 2022-11-02 LAB — CBC WITH DIFFERENTIAL/PLATELET
Abs Immature Granulocytes: 0.04 10*3/uL (ref 0.00–0.07)
Basophils Absolute: 0.1 10*3/uL (ref 0.0–0.1)
Basophils Relative: 1 %
Eosinophils Absolute: 0.2 10*3/uL (ref 0.0–0.5)
Eosinophils Relative: 2 %
HCT: 46 % (ref 39.0–52.0)
Hemoglobin: 16.1 g/dL (ref 13.0–17.0)
Immature Granulocytes: 0 %
Lymphocytes Relative: 19 %
Lymphs Abs: 1.7 10*3/uL (ref 0.7–4.0)
MCH: 34 pg (ref 26.0–34.0)
MCHC: 35 g/dL (ref 30.0–36.0)
MCV: 97 fL (ref 80.0–100.0)
Monocytes Absolute: 0.5 10*3/uL (ref 0.1–1.0)
Monocytes Relative: 6 %
Neutro Abs: 6.4 10*3/uL (ref 1.7–7.7)
Neutrophils Relative %: 72 %
Platelets: 383 10*3/uL (ref 150–400)
RBC: 4.74 MIL/uL (ref 4.22–5.81)
RDW: 14.3 % (ref 11.5–15.5)
WBC: 8.9 10*3/uL (ref 4.0–10.5)
nRBC: 0 % (ref 0.0–0.2)

## 2022-11-02 LAB — RESP PANEL BY RT-PCR (RSV, FLU A&B, COVID)  RVPGX2
Influenza A by PCR: NEGATIVE
Influenza B by PCR: NEGATIVE
Resp Syncytial Virus by PCR: NEGATIVE
SARS Coronavirus 2 by RT PCR: NEGATIVE

## 2022-11-02 LAB — POCT URINE DRUG SCREEN - MANUAL ENTRY (I-SCREEN)
POC Amphetamine UR: NOT DETECTED
POC Buprenorphine (BUP): NOT DETECTED
POC Cocaine UR: POSITIVE — AB
POC Marijuana UR: POSITIVE — AB
POC Methadone UR: NOT DETECTED
POC Methamphetamine UR: NOT DETECTED
POC Morphine: NOT DETECTED
POC Oxazepam (BZO): NOT DETECTED
POC Oxycodone UR: NOT DETECTED
POC Secobarbital (BAR): NOT DETECTED

## 2022-11-02 LAB — ETHANOL: Alcohol, Ethyl (B): <10 mg/dL (ref <10)

## 2022-11-02 LAB — COMPREHENSIVE METABOLIC PANEL
ALT: 44 U/L (ref 0–44)
AST: 77 U/L — ABNORMAL HIGH (ref 15–41)
Albumin: 5 g/dL (ref 3.5–5.0)
Alkaline Phosphatase: 49 U/L (ref 38–126)
Anion gap: 14 (ref 5–15)
BUN: 9 mg/dL (ref 6–20)
CO2: 27 mmol/L (ref 22–32)
Calcium: 9.6 mg/dL (ref 8.9–10.3)
Chloride: 91 mmol/L — ABNORMAL LOW (ref 98–111)
Creatinine, Ser: 0.9 mg/dL (ref 0.61–1.24)
GFR, Estimated: >60 mL/min (ref >60)
Glucose, Bld: 65 mg/dL — ABNORMAL LOW (ref 70–99)
Potassium: 4.2 mmol/L (ref 3.5–5.1)
Sodium: 132 mmol/L — ABNORMAL LOW (ref 135–145)
Total Bilirubin: 2.1 mg/dL — ABNORMAL HIGH (ref 0.3–1.2)
Total Protein: 8 g/dL (ref 6.5–8.1)

## 2022-11-02 LAB — LIPID PANEL
Cholesterol: 237 mg/dL — ABNORMAL HIGH (ref 0–200)
HDL: >135 mg/dL (ref >40)
Triglycerides: 157 mg/dL — ABNORMAL HIGH (ref <150)
VLDL: 31 mg/dL (ref 0–40)

## 2022-11-02 LAB — MAGNESIUM: Magnesium: 2.2 mg/dL (ref 1.7–2.4)

## 2022-11-02 LAB — HEMOGLOBIN A1C
Hgb A1c MFr Bld: 5 % (ref 4.8–5.6)
Mean Plasma Glucose: 96.8 mg/dL

## 2022-11-02 LAB — TSH: TSH: 1.563 u[IU]/mL (ref 0.350–4.500)

## 2022-11-02 LAB — POC SARS CORONAVIRUS 2 AG: SARSCOV2ONAVIRUS 2 AG: NEGATIVE

## 2022-11-02 MED ORDER — LORAZEPAM 1 MG PO TABS
1.0000 mg | ORAL_TABLET | Freq: Four times a day (QID) | ORAL | Status: DC | PRN
  Administered 2022-11-02: 1 mg via ORAL
  Filled 2022-11-02: qty 1

## 2022-11-02 MED ORDER — ADULT MULTIVITAMIN W/MINERALS CH
1.0000 | ORAL_TABLET | Freq: Every day | ORAL | Status: DC
  Administered 2022-11-02: 1 via ORAL
  Filled 2022-11-02: qty 1

## 2022-11-02 MED ORDER — ALUM & MAG HYDROXIDE-SIMETH 200-200-20 MG/5ML PO SUSP: 30.0000 mL | ORAL | Status: DC | PRN

## 2022-11-02 MED ORDER — MAGNESIUM HYDROXIDE 400 MG/5ML PO SUSP: 30.0000 mL | Freq: Every day | ORAL | Status: DC | PRN

## 2022-11-02 MED ORDER — FLUOXETINE HCL 10 MG PO CAPS: 10.0000 mg | ORAL_CAPSULE | Freq: Every day | ORAL | Status: DC

## 2022-11-02 MED ORDER — ONDANSETRON 4 MG PO TBDP: 4.0000 mg | ORAL_TABLET | Freq: Four times a day (QID) | ORAL | Status: DC | PRN

## 2022-11-02 MED ORDER — NICOTINE 14 MG/24HR TD PT24: 14.0000 mg | MEDICATED_PATCH | Freq: Every day | TRANSDERMAL | Status: DC

## 2022-11-02 MED ORDER — LOPERAMIDE HCL 2 MG PO CAPS: 2.0000 mg | ORAL_CAPSULE | ORAL | Status: DC | PRN

## 2022-11-02 MED ORDER — TRAZODONE HCL 50 MG PO TABS: 50.0000 mg | ORAL_TABLET | Freq: Every evening | ORAL | Status: DC | PRN

## 2022-11-02 MED ORDER — HYDROXYZINE HCL 25 MG PO TABS
25.0000 mg | ORAL_TABLET | Freq: Three times a day (TID) | ORAL | Status: DC | PRN
  Administered 2022-11-02: 25 mg via ORAL
  Filled 2022-11-02: qty 1

## 2022-11-02 MED ORDER — THIAMINE MONONITRATE 100 MG PO TABS: 100.0000 mg | ORAL_TABLET | Freq: Every day | ORAL | Status: DC

## 2022-11-02 MED ORDER — THIAMINE HCL 100 MG/ML IJ SOLN
100.0000 mg | Freq: Once | INTRAMUSCULAR | Status: AC
  Administered 2022-11-02: 100 mg via INTRAMUSCULAR
  Filled 2022-11-02: qty 2

## 2022-11-02 MED ORDER — ACETAMINOPHEN 325 MG PO TABS: 650.0000 mg | ORAL_TABLET | Freq: Four times a day (QID) | ORAL | Status: DC | PRN

## 2022-11-02 MED ORDER — QUETIAPINE FUMARATE 25 MG PO TABS
25.0000 mg | ORAL_TABLET | Freq: Every day | ORAL | Status: DC
  Administered 2022-11-02: 25 mg via ORAL
  Filled 2022-11-02: qty 1

## 2022-11-02 NOTE — Progress Notes (Signed)
   11/02/22 1555  [place] (Walk-ins at [facility] only)  How Did You Hear About [place]? Self  What Is the Reason for Your Visit/Call Today? Patient presents reporting worsening depression and anxiety, appearing visibly anxious, with tremors and shaking.  He reports he lost his mother and brother last year. His mother was ill and he was caring for her.  3 days after his mother passed away, his brother committed suicide.  Patient has limited support, as his father was in and out of his life and is "more of a friend."  He has been in counseling with [facility] outpatient and was Rx medication, however discontinued treatment 6 mos ago.  He doesn't recall the names of the medications.  He has had ongoing suicidal ideations for the past year, and the thoughts have become more intrusive over the past month.  Patient states he had been clean/sober and relapsed on alcohol and cocaine 3 mos ago.  For the 3 months, he has been drinking 5 beers daily and for the last month he has been using cocaine more regularly (3-4 x per week- less than $10 worth per day).  Patient states he is feeling unsafe today, as the suicidal thoughts have become more intrusive.  He has hx of an attempt by taking pills and cutting his wrists 20 yrs ago, after which he was admitted to [facility].  He denies having a specific plan, however mentions thoughts of jumping from a bridge.  He is unable to reliably affirm his safety at this time.  How Long Has This Been Causing You Problems? > than 6 months  Have You Recently Had Any Thoughts About Hurting Yourself? Yes  How long ago did you have thoughts about hurting yourself? today  Are You Planning to [place] At This time? Yes (considered plan to jump from bridge - unable to contract for safety)  Have you Recently Had Thoughts About [place]? No  Are You Planning To Harm Someone At This Time? No  Are you currently experiencing any auditory, visual or other  hallucinations? No  Have You Used Any Alcohol or Drugs in the Past 24 Hours? Yes  How long ago did you use Drugs or Alcohol? last night  What Did You Use and How Much? 5 beers  Do you have any current medical co-morbidities that require immediate attention? No  Clinician description of patient physical appearance/behavior: Patient is tearful, cooperative AAOx5  What Do You Feel Would Help You the Most Today? Treatment for Depression or other mood problem  If access to [facility] was not available, would you have sought care in the Emergency Department? No  Determination of Need Urgent (48 hours)  Options For Referral Medication Management;Intensive Outpatient Therapy;Inpatient Hospitalization;Facility-Based Crisis;BH Urgent Care

## 2022-11-02 NOTE — ED Provider Notes (Signed)
[facility] Care Continuous Assessment Admission H&P  Date: 11/02/22 Patient Name: Christian Andrews MRN: [id number] Chief Complaint: No chief complaint on file.     Diagnoses:  Final diagnoses:  Severe episode of recurrent major depressive disorder, without psychotic features (HCC)  Alcohol use disorder, severe, in early remission (HCC)  Suicidal ideation    HPI: Patient presents voluntarily to [facility] behavioral health for walk-in assessment.  Patient is assessed, face-to-face, by nurse practitioner. He is seated in assessment area, no acute distress. Consulted with provider, Dr.  Lucianne Muss, and chart reviewed on 11/02/2022. He  is alert and oriented, pleasant and cooperative during assessment.   Patient states "I came back because I know I need to get some help, I feel fed up and worthless and just tired."  Patient  presents with depressed mood, tearful affect.  He endorses suicidal ideation increasing in frequency and severity x 3 to 4 weeks.Marland Kitchen  He endorses vague plan of "if there was a bridge then I would just jump off of it, that is how I feel but my brother killed himself and I do not think that I want to do that, maybe he saved me?"  Patient is unable to contract verbally for safety with this Clinical research associate.  He endorses history of 2 previous suicide attempts.  At age 10 he attempted to slit his wrists and at age 24 he ingested an intentional overdose.  He endorses history of 3 previous inpatient psychiatric admissions.  Most recent admission in July 2023 at [facility] behavioral health.  Family mental health history includes patient's mother and brother who were diagnosed with bipolar disorder.  Patient's brother also suffered from heroin addiction and completed suicide.  Patient endorses depressive symptoms worsening x 4 weeks including depressed mood, suicidal ideation, decreased sleep, feelings of worthlessness.  Recent stressors include unstable housing.  Patient currently residing with friends  who have thrown him out 2 times.  Chronic stressors include the death of his mother and brother approximately 1.5 years ago.  Patient reports the holidays were especially triggering.  Patient reports increase in alcohol and substance use.  After 8 months sobriety patient relapsed on alcohol and cocaine 3 months ago.  Cocaine use an average of 3-4 times per week.  Most recent cocaine use 3 days ago.  Alcohol use an average of 3 times per week.  Typically consumes 3-4 drinks each episode.  Most recent alcohol use approximately 24 hours ago.  Over the last 7 days patient used alcohol every day.  Patient also uses tobacco-nicotine cigarettes daily.  He denies substance use aside from alcohol and cocaine.  Patient is not linked with outpatient psychiatry currently.  After discharge from [facility] behavioral health in July 2023 patient faced financial difficulties as well as difficulty with transportation and followed up briefly at [facility] behavioral health outpatient.  He was unable to continue medications including Prozac and quetiapine related to financial difficulties.  Would like to be linked with outpatient psychiatry as well as counseling moving forward.   Patient has normal speech and behavior.  He denies homicidal ideation.  He denies history of nonsuicidal self-harm behavior.  He  denies auditory and visual hallucinations.  Patient is able to converse coherently with goal-directed thoughts and no distractibility or preoccupation.  Denies symptoms of paranoia.  Objectively there is no evidence of psychosis/mania or delusional thinking.  Spero recently residing in [place] with friends.  He is employed in the Product/process development scientist.  Patient endorses average appetite.  Patient  offered support and encouragement.  Reviewed plan to include inpatient psychiatric admission.  Discussed medications as well as patient's preference to remain full CODE STATUS.  Patient verbalizes understanding and  agreement with plan.   PHQ 2-9:  [place] Office Visit from 05/09/2022 in [facility]  Thoughts that you would be better off dead, or of hurting yourself in some way More than half the days  PHQ-9 Total Score 21       [place] ED from 11/02/2022 in [facility] Admission (Discharged) from 04/26/2022 in [facility] [facility] ED from 04/25/2022 in [place] High Risk Moderate Risk High Risk        Total Time spent with patient: 30 minutes  Musculoskeletal  Strength & Muscle Tone: within normal limits Gait & Station: normal Patient leans: N/A  Psychiatric Specialty Exam  Presentation General Appearance:  Appropriate for Environment; Casual  Eye Contact: Good  Speech: Clear and Coherent; Normal Rate  Speech Volume: Normal  Handedness: Right   Mood and Affect  Mood: Depressed; Worthless  Affect: Depressed; Tearful   Thought Process  Thought Processes: Coherent; Goal Directed; Linear  Descriptions of Associations:Intact  Orientation:Full (Time, Place and Person)  Thought Content:Logical; WDL  Diagnosis of Schizophrenia or Schizoaffective disorder in past: No   Hallucinations:Hallucinations: None  Ideas of Reference:None  Suicidal Thoughts:Suicidal Thoughts: Yes, Active SI Active Intent and/or Plan: Without Intent  Homicidal Thoughts:Homicidal Thoughts: No   Sensorium  Memory: Immediate Good; Recent Good  Judgment: Fair  Insight: Fair   Community education officer  Concentration: Good  Attention Span: Good  Recall: Good  Fund of Knowledge: Good  Language: Good   Psychomotor Activity  Psychomotor Activity: Psychomotor Activity: Normal   Assets  Assets: Communication Skills; Desire for Improvement; Physical Health; Resilience; Social Support   Sleep  Sleep: Sleep: Poor   Nutritional  Assessment (For OBS and FBC admissions only) Has the patient had a weight loss or gain of 10 pounds or more in the last 3 months?: No Has the patient had a decrease in food intake/or appetite?: No Does the patient have dental problems?: No Does the patient have eating habits or behaviors that may be indicators of an eating disorder including binging or inducing vomiting?: No Has the patient recently lost weight without trying?: 0 Has the patient been eating poorly because of a decreased appetite?: 0 Malnutrition Screening Tool Score: 0    Physical Exam Vitals and nursing note reviewed.  Constitutional:      Appearance: Normal appearance. He is well-developed and normal weight.  HENT:     Head: Normocephalic and atraumatic.     Nose: Nose normal.  Cardiovascular:     Rate and Rhythm: Normal rate.  Pulmonary:     Effort: Pulmonary effort is normal.  Musculoskeletal:        General: Normal range of motion.     Cervical back: Normal range of motion.  Skin:    General: Skin is warm and dry.  Neurological:     Mental Status: He is alert and oriented to person, place, and time.  Psychiatric:        Attention and Perception: Attention and perception normal.        Mood and Affect: Mood is depressed. Affect is tearful.        Speech: Speech normal.        Behavior: Behavior normal. Behavior is cooperative.  Thought Content: Thought content includes suicidal ideation. Thought content includes suicidal plan.        Cognition and Memory: Cognition and memory normal.    Review of Systems  Constitutional: Negative.   HENT: Negative.    Eyes: Negative.   Respiratory: Negative.    Cardiovascular: Negative.   Gastrointestinal: Negative.   Genitourinary: Negative.   Musculoskeletal: Negative.   Skin: Negative.   Neurological: Negative.   Psychiatric/Behavioral:  Positive for depression, substance abuse and suicidal ideas.     Blood pressure 125/77, pulse (!) 102, temperature  98 F (36.7 C), resp. rate 19, height 6\' 2"  (1.88 m), weight 167 lb (75.8 kg), SpO2 98 %. Body mass index is 21.44 kg/m.  Past Psychiatric History: Major depressive disorder, recurrent severe, without psychosis, generalized anxiety disorder, alcohol use disorder, stimulant use disorder, PTSD, polysubstance use disorder  Is the patient at risk to self? Yes  Has the patient been a risk to self in the past 6 months? Yes .    Has the patient been a risk to self within the distant past? Yes   Is the patient a risk to others? No   Has the patient been a risk to others in the past 6 months? No   Has the patient been a risk to others within the distant past? No   Past Medical History: No past medical history on file.  Past Surgical History:  Procedure Laterality Date   TONSILECTOMY/ADENOIDECTOMY WITH MYRINGOTOMY Bilateral 1997    Family History: No family history on file.  Social History:  Social History   Socioeconomic History   Marital status: Single    Spouse name: Not on file   Number of children: Not on file   Years of education: Not on file   Highest education level: Not on file  Occupational History   Not on file  Tobacco Use   Smoking status: Every Day    Packs/day: 0.75    Types: Cigarettes   Smokeless tobacco: Never  Substance and Sexual Activity   Alcohol use: Not Currently   Drug use: Not Currently    Types: Cocaine   Sexual activity: Yes  Other Topics Concern   Not on file  Social History Narrative   Not on file   Social Determinants of Health   Financial Resource Strain: Not on file  Food Insecurity: Not on file  Transportation Needs: Not on file  Physical Activity: Not on file  Stress: Not on file  Social Connections: Not on file  Intimate Partner Violence: Not on file    SDOH:  SDOH Screenings   Alcohol Screen: Low Risk  (04/26/2022)  Depression (PHQ2-9): High Risk (05/09/2022)  Tobacco Use: High Risk (05/09/2022)    Last Labs:  No visits with  results within 6 Month(s) from this visit.  Latest known visit with results is:  Admission on 04/26/2022, Discharged on 04/30/2022  Component Date Value Ref Range Status   Ferritin 04/29/2022 171  24 - 336 ng/mL Final   Performed at [facility], [place] [address], [place], [place] [address]   Iron 04/29/2022 140  45 - 182 ug/dL Final   TIBC 04/29/2022 290  250 - 450 ug/dL Final   Saturation Ratios 04/29/2022 48 (H)  17.9 - 39.5 % Final   UIBC 04/29/2022 150  ug/dL Final   Performed at [place] [address], [place], [place] [address]   Retic Ct Pct 04/29/2022 1.0  0.4 - 3.1 %  Final   RBC. 04/29/2022 3.67 (L)  4.22 - 5.81 MIL/uL Final   Retic Count, Absolute 04/29/2022 36.3  19.0 - 186.0 K/uL Final   Immature Retic Fract 04/29/2022 5.4  2.3 - 15.9 % Final   Performed at [facility], [address] [place], [place] [address]    Allergies: Patient has no known allergies.  PTA Medications: (Not in a hospital admission)   Medical Decision Making  Patient remains voluntary.  He will be admitted to observation unit at [facility] behavioral health while awaiting inpatient psychiatric treatment.  Inpatient psychiatric treatment recommended.  Laboratory studies ordered including CBC, CMP, ethanol, A1c, lipid panel, magnesium, prolactin and TSH.  Urine drug screen order initiated.  EKG ordered.  Current medications: -Acetaminophen 650 mg every 6 as needed/mild pain -Maalox 30 mL oral every 4 as needed/digestion -Hydroxyzine 25 mg 3 times daily as needed/anxiety -Magnesium hydroxide 30 mL daily as needed/mild constipation -Trazodone 50 mg nightly as needed/sleep  -Fluoxetine 10 mg daily/mood -Quetiapine 25 mg nightly/mood -NicoDerm nicotine transdermal patch 14 mg daily/nicotine withdrawal  CIWA Ativan protocol initiated: -Loperamide 2 to 4 mg oral as needed/diarrhea or loose stools -Lorazepam 1 mg every 6 hours as  needed CIWA greater than 10 -Multivitamin with minerals 1 tablet daily -Ondansetron disintegrating tablet 4 mg every 6 as needed/nausea or vomiting -Thiamine injection 100 mg IM once -Thiamine tablet 100 mg daily     Recommendations  Based on my evaluation the patient does not appear to have an emergency medical condition.  Lenard Lance, FNP 11/02/22  6:58 PM

## 2022-11-02 NOTE — ED Notes (Signed)
Pt asleep at this hour. No apparent distress. RR even and unlabored. Monitored for safety.

## 2022-11-02 NOTE — Progress Notes (Signed)
Pt was accepted to [facility] 1/18/2 PENDING Vol consent faxed to [phone] an labs  Pt meets inpatient criteria per Beatriz Stallion, FNP  Attending Physician will be Dr. Caswell Corwin  Report can be called to: Adult unit: [phone]  Pt can arrive after: PENDING ITEMS  Care Team: [facility] Clayborne Dana, RN, Beatriz Stallion, FNP, Drema Halon, RN, Anda Latina, RN, Evette Georges, Night [facility] Linneus, RN  [place], [place] 11/02/2022 @ 7:58 PM

## 2022-11-02 NOTE — Progress Notes (Addendum)
[facility] Nursing staff Miguel Rota, RN advised that EKG & Voluntary Form have been faxed to [facility]. Night [facility] Oluwatosin Olasunkanmi, RN will assist and coordinate with bed assignment information and arrival time.  Benjaman Kindler, MSW, [facility] 11/02/2022 11:51 PM

## 2022-11-02 NOTE — ED Notes (Signed)
Pt A&O x 4, presents with suicidal ideations,plan to jump from a bridge, previous SI Attempts noted. Pt reports he relapsed on  alcohol & cocaine for the past 3 mos.  Unable to contract for safety at this time.  Monitoring for safety.

## 2022-11-02 NOTE — BH Assessment (Signed)
Comprehensive Clinical Assessment (CCA) Note  11/02/2022 Christian Andrews [id number]  Disposition: Comprehensive Clinical Assessment completed.  It appears inpatient psychiatric treatment will be recommended.  Disposition pending provider MSE and recommendations.  The patient demonstrates the following risk factors for suicide: Chronic risk factors for suicide include: psychiatric disorder of MDD, GAD, substance use disorder, and previous suicide attempts x1 20 yrs ago- admitted to [facility] following attempt to take pills and cut wrist . Acute risk factors for suicide include: loss (financial, interpersonal, professional). Protective factors for this patient include: positive social support and coping skills. Considering these factors, the overall suicide risk at this point appears to be moderate. Patient is appropriate for outpatient follow up once stabilized.   Patient is a 42 year old male with a history of Major Depressive Disorder, recurrent, severe, Generalized Anxiety Disorder, Alcohol Use Disorder, moderate and Cocaine Use Disorder, moderate who presents voluntarily to [facility] for assessment.  Patient presents reporting worsening depression and anxiety; appearing visibly anxious, with tremors and shaking. He reports he lost his mother and brother last year. His mother was ill and he was caring for her. Three days after his mother passed away, his brother committed suicide. Patient has limited support, as his father was in and out of his life and is "more of a friend." Patient states he lives with a roommate in [place], however the roommate kicks him out from time to time when the roommate and his girlfriend are fighting.  He is currently uncertain of the status and states he is tired of feeling unsettled with this arrangement.  He also feels overwhelmed, stating he is "35 and I have nothing of my own, nothing."  He works as a Production assistant, radio at [employer], and is is not able to  afford his own place at this time with the rise in rent rates.  Patient states he has been in counseling with [facility] outpatient and was Rx medication, however he discontinued treatment 6 mos ago. He doesn't recall the names of the medications. He has had ongoing suicidal ideations for the past year, and the thoughts have become more intrusive over the past month. Patient has a hx of one past suicide attempt, stating approximately 20 yrs ago he attempted by overdosing and cutting his wrists.  He was admitted to [facility] following the attempt.    Patient states he had been clean/sober and relapsed on alcohol and cocaine 3 mos ago. For the 3 months, he has been drinking 5 beers daily and for the last month he has been using cocaine more regularly (3-4 x per week- less than $10 worth per day). Patient states he is feeling unsafe today, as the suicidal thoughts have become more intrusive. He has hx of an attempt by taking pills and cutting his wrists 20 yrs ago, after which he was admitted to [facility]. He denies having a specific plan, however mentions thoughts of jumping from a bridge. He is unable to reliably affirm his safety at this time.    Chief Complaint: worsening depression, SI  Visit Diagnosis: Major Depressive Disorder, recurrent, severe without psychotic features                             Generalized Anxiety Disorder                             Alcohol Use Disorder, moderate  Cocaine Use Disorder, moderate   CCA Screening, Triage and Referral (STR)  Patient Reported Information How did you hear about us? Self  What Is the Reason for Your Visit/Call Today? Patient presents reporting worsening depression and anxiety, appearing visibly anxious, with tremors and shaking.  He reports he lost his mother and brother last year. His mother was ill and he was caring for her.  3 days after his mother passed away, his brother committed suicide.  Patient has limited support, as his  father was in and out of his life and is "more of a friend."  He has [facility] in [facility] and was Rx medication, however discontinued treatment 6 mos ago.  He doesn't recall the names of the medications.  He has had ongoing suicidal ideations for the past year, and the thoughts have become more intrusive over the past month.  Patient states he had been clean/sober and relapsed on alcohol and cocaine 3 mos ago.  For the 3 months, he has been drinking 5 beers daily and for the last month he has been using cocaine more regularly (3-4 x per week- less than $10 worth per day).  Patient states he is feeling unsafe today, as the suicidal thoughts have become more intrusive.  He has hx of an attempt by taking pills and cutting his wrists 20 yrs ago, after which [facility] was [facility].  He denies having a specific plan, however mentions thoughts of jumping from a bridge.  He is unable to reliably affirm his safety at this time.  How Long Has This Been Causing You Problems? > than 6 months  What Do You Feel Would Help You the Most Today? Treatment for Depression or other mood problem   Have You Recently Had Any Thoughts About Hurting Yourself? Yes  Are You Planning to Commit Suicide/Harm Yourself At This time? Yes (considered plan to jump from bridge - unable to contract for safety)  Flowsheet Row [facility]) [facility]  C-SSRS RISK CATEGORY High Risk Moderate Risk High Risk         Have you Recently Had Thoughts About Hurting Someone Karolee Ohslse? No  Are You Planning to Harm Someone at This Time? No  Explanation: N/A   Have You Used Any Alcohol or Drugs in the Past 24 Hours? Yes  What Did You Use and How Much? 5 beers   Do You Currently Have a Therapist/Psychiatrist? No  Name of  Therapist/Psychiatrist: Name of Therapist/Psychiatrist: N/A   Have You Been Recently Discharged From Any Office Practice or Programs? No data recorded Explanation of Discharge From Practice/Program: N/A     CCA Screening Triage Referral Assessment Type of Contact: Face-to-Face  Telemedicine Service Delivery:   Is this Initial or Reassessment?   Date Telepsych consult ordered in [facility]:    Time Telepsych consult ordered in [facility]:    [facility]  [facility]   Collateral Involvement: None - chart review   Does Patient Have a Automotive engineerCourt Appointed Legal Guardian? No  Legal Guardian Contact Information: N/A  Copy of Legal Guardianship Form: -- (N/A)  Legal Guardian Notified of Arrival: -- (N/A)  Legal Guardian Notified of Pending Discharge: -- (N/A)  If Minor and Not Living with Parent(s), Who has Custody? N/A  Is CPS involved or  ever been involved? Never  Is APS involved or ever been involved? Never   Patient Determined To Be At Risk for Harm To Self or Others Based on Review of Patient Reported Information or Presenting Complaint? Yes, for Self-Harm  Method: -- (N/A - SI , not HI)  Availability of Means: -- (N/A)  Intent: -- (N/A)  Notification Required: -- (N/A)  Additional Information for Danger to Others Potential: No data recorded Additional Comments for Danger to Others Potential: N/A  Are There Guns or Other Weapons in Your Home? No  Types of Guns/Weapons: N/A  Are These Weapons Safely Secured?                            -- (N/A)  Who Could Verify You Are Able To Have These Secured: N/A  Do You Have any Outstanding Charges, Pending Court Dates, Parole/Probation? None  Contacted To Inform of Risk of Harm To Self or Others: No data recorded   Does Patient Present under Involuntary Commitment? No    [place] of Residence: [place]   Patient Currently Receiving the Following Services:  Not Receiving Services   Determination of Need: Urgent (48 hours)   Options For Referral: Medication Management; Intensive Outpatient Therapy; Inpatient Hospitalization; Facility-Based Crisis; [place] [facility]     CCA Biopsychosocial Patient Reported Schizophrenia/Schizoaffective Diagnosis in Past: No   Strengths: Advocates himself, has supportive friends, works   Mental Health Symptoms Depression:   Change in energy/activity; Difficulty Concentrating; Irritability; Sleep (too much or little); Tearfulness   Duration of Depressive symptoms:  Duration of Depressive Symptoms: Greater than two weeks   Mania:   None   Anxiety:    None   Psychosis:   None   Duration of Psychotic symptoms:    Trauma:   None   Obsessions:   None   Compulsions:   None   Inattention:   None   Hyperactivity/Impulsivity:   None   Oppositional/Defiant Behaviors:   None   Emotional Irregularity:   None   Other Mood/Personality Symptoms:  No data recorded   Mental Status Exam Appearance and self-care  Stature:   Average   Weight:   Average weight   Clothing:   Neat/clean   Grooming:   Normal   Cosmetic use:   None   Posture/gait:   Normal   Motor activity:   Not Remarkable   Sensorium  Attention:   Normal   Concentration:   Normal   Orientation:   X5   Recall/memory:   Normal   Affect and Mood  Affect:   Depressed   Mood:   Depressed   Relating  Eye contact:   Normal   Facial expression:   Responsive   Attitude toward examiner:   Cooperative   Thought and Language  Speech flow:  Clear and Coherent   Thought content:   Appropriate to Mood and Circumstances   Preoccupation:   None   Hallucinations:   None   Organization:   Coherent; Intact   [employer] of Knowledge:   Average   Intelligence:   Average   Abstraction:   Normal   Judgement:   Impaired   Reality Testing:   Adequate   Insight:    Lacking (suicidal ideations with a plan to jump off a building)   Decision Making:   Normal; Vacilates   Social Functioning  Social Maturity:   Responsible   Social Judgement:  Normal   Stress  Stressors:   Grief/losses; Financial   Coping Ability:   Overwhelmed   Skill Deficits:   Responsibility; Self-control; Decision making   Supports:   Support needed; Friends/Service system     Religion: Religion/Spirituality Are You A Religious Person?: No How Might This Affect Treatment?: NA  Leisure/Recreation: Leisure / Recreation Do You Have Hobbies?: Yes Leisure and Hobbies: Playing pool, walking outside, and reading  Exercise/Diet: Exercise/Diet Do You Exercise?: Yes What Type of Exercise Do You Do?: Run/Walk How Many Times a Week Do You Exercise?: 1-3 times a week Have You Gained or Lost A Significant Amount of Weight in the Past Six Months?: No Do You Follow a Special Diet?: No Do You Have Any Trouble Sleeping?: Yes Explanation of Sleeping Difficulties: intermittent difficulty with sleep   CCA Employment/Education Employment/Work Situation: Employment / Work Situation Employment Situation: Employed Work Stressors: none Patient's Job has Been Impacted by Current Illness: No Has Patient ever Been in Passenger transport manager?: No  Education: Education Is Patient Currently Attending School?: No Last Grade Completed: 12 Did You Nutritional therapist?: No Did You Have An Individualized Education Program (IIEP): No Did You Have Any Difficulty At [employer]?: No Patient's Education Has Been Impacted by Current Illness: No   CCA Family/Childhood History Family and Relationship History: Family history Marital status: Single Does patient have children?: No  Childhood History:  Childhood History By whom was/is the patient raised?: Mother Did patient suffer any verbal/emotional/physical/sexual abuse as a child?: No Did patient suffer from severe childhood neglect?: No Has  patient ever been sexually abused/assaulted/raped as an adolescent or adult?: No Was the patient ever a victim of a crime or a disaster?: No Witnessed domestic violence?: No Has patient been affected by domestic violence as an adult?: No       CCA Substance Use Alcohol/Drug Use: Alcohol / Drug Use Pain Medications: see MAR Prescriptions: see MAR Over the Counter: see MAR History of alcohol / drug use?: Yes Longest period of sobriety (when/how long): NA Negative Consequences of Use: Financial, Personal relationships Withdrawal Symptoms: None Substance #1 Name of Substance 1: ETOH 1 - Age of First Use: 20s 1 - Amount (size/oz): 5 beers 1 - Frequency: daily 1 - Duration: 3 mos 1 - Last Use / Amount: last night - 5 beers 1 - Method of Aquiring: NA 1- Route of Use: drinks Substance #2 Name of Substance 2: Cocaine 2 - Age of First Use: 18 2 - Amount (size/oz): "hit" less thatn $10 2 - Frequency: 3-4 x weekly 2 - Duration: 1 month 2 - Last Use / Amount: several days ago, amt unknown 2 - Method of Aquiring: NA 2 - Route of Substance Use: snorts                     ASAM's:  Six Dimensions of Multidimensional Assessment  Dimension 1:  Acute Intoxication and/or Withdrawal Potential:   Dimension 1:  Description of individual's past and current experiences of substance use and withdrawal: current daily alcohol use, weekly cocaine use, no withdrawal noted  Dimension 2:  Biomedical Conditions and Complications:   Dimension 2:  Description of patient's biomedical conditions and  complications: none  Dimension 3:  Emotional, Behavioral, or Cognitive Conditions and Complications:  Dimension 3:  Description of emotional, behavioral, or cognitive conditions and complications: dealing with grief after losing his mother and brother, unemployed  Dimension 4:  Readiness to Change:  Dimension 4:  Description of Readiness to Change  criteria: seeking treatment  Dimension 5:  Relapse,  Continued use, or Continued Problem Potential:  Dimension 5:  Relapse, continued use, or continued problem potential critiera description: Ltd awareness of MI and SA related issues  Dimension 6:  Recovery/Living Environment:  Dimension 6:  Recovery/Iiving environment criteria description: friends are supportive  ASAM Severity Score: ASAM's Severity Rating Score: 4  ASAM Recommended Level of Treatment: ASAM Recommended Level of Treatment: Level III Residential Treatment   Substance use Disorder (SUD) Substance Use Disorder (SUD)  Checklist Symptoms of Substance Use: Continued use despite persistent or recurrent social, interpersonal problems, caused or exacerbated by use, Persistent desire or unsuccessful efforts to cut down or control use, Recurrent use that results in a failure to fulfill major role obligations (work, school, home), Presence of craving or strong urge to use, Social, occupational, recreational activities given up or reduced due to use  Recommendations for Services/Supports/Treatments: Recommendations for Services/Supports/Treatments Recommendations For Services/Supports/Treatments: Individual Therapy, CD-IOP Intensive Chemical Dependency Program, Facility Based Crisis, Inpatient Hospitalization  Discharge Disposition:    DSM5 Diagnoses: Patient Active Problem List   Diagnosis Date Noted   Major depressive disorder, recurrent episode, severe ([place]) 04/26/2022   Generalized anxiety disorder 04/26/2022   Alcohol use disorder, severe, in early remission ([place]) 04/26/2022   Stimulant use disorder 04/26/2022     Referrals to Alternative Service(s): Referred to Alternative Service(s):   Place:   Date:   Time:    Referred to Alternative Service(s):   Place:   Date:   Time:    Referred to Alternative Service(s):   Place:   Date:   Time:    Referred to Alternative Service(s):   Place:   Date:   Time:     Fransico Meadow, [facility]

## 2022-11-03 ENCOUNTER — Other Ambulatory Visit: Payer: Self-pay

## 2022-11-03 ENCOUNTER — Inpatient Hospital Stay (HOSPITAL_COMMUNITY)
Admission: AD | Admit: 2022-11-03 | Discharge: 2022-11-08 | DRG: 885 | Payer: Federal, State, Local not specified - Other | Source: Intra-hospital | Attending: Psychiatry | Admitting: Psychiatry

## 2022-11-03 ENCOUNTER — Encounter (HOSPITAL_COMMUNITY): Payer: Self-pay | Admitting: Psychiatry

## 2022-11-03 DIAGNOSIS — F1090 Alcohol use, unspecified, uncomplicated: Secondary | ICD-10-CM | POA: Diagnosis present

## 2022-11-03 DIAGNOSIS — Z9151 Personal history of suicidal behavior: Secondary | ICD-10-CM

## 2022-11-03 DIAGNOSIS — F17210 Nicotine dependence, cigarettes, uncomplicated: Secondary | ICD-10-CM | POA: Diagnosis present

## 2022-11-03 DIAGNOSIS — F1510 Other stimulant abuse, uncomplicated: Secondary | ICD-10-CM | POA: Diagnosis present

## 2022-11-03 DIAGNOSIS — R45851 Suicidal ideations: Secondary | ICD-10-CM | POA: Diagnosis present

## 2022-11-03 DIAGNOSIS — K0889 Other specified disorders of teeth and supporting structures: Secondary | ICD-10-CM | POA: Diagnosis present

## 2022-11-03 DIAGNOSIS — G4700 Insomnia, unspecified: Secondary | ICD-10-CM | POA: Diagnosis present

## 2022-11-03 DIAGNOSIS — F1010 Alcohol abuse, uncomplicated: Secondary | ICD-10-CM | POA: Diagnosis present

## 2022-11-03 DIAGNOSIS — F1590 Other stimulant use, unspecified, uncomplicated: Secondary | ICD-10-CM | POA: Diagnosis present

## 2022-11-03 DIAGNOSIS — Z79899 Other long term (current) drug therapy: Secondary | ICD-10-CM | POA: Diagnosis not present

## 2022-11-03 DIAGNOSIS — Z91199 Patient's noncompliance with other medical treatment and regimen due to unspecified reason: Secondary | ICD-10-CM | POA: Diagnosis not present

## 2022-11-03 DIAGNOSIS — F515 Nightmare disorder: Secondary | ICD-10-CM | POA: Diagnosis present

## 2022-11-03 DIAGNOSIS — F411 Generalized anxiety disorder: Secondary | ICD-10-CM | POA: Diagnosis present

## 2022-11-03 DIAGNOSIS — F332 Major depressive disorder, recurrent severe without psychotic features: Secondary | ICD-10-CM | POA: Diagnosis present

## 2022-11-03 DIAGNOSIS — F1021 Alcohol dependence, in remission: Secondary | ICD-10-CM

## 2022-11-03 MED ORDER — VITAMIN B-1 100 MG PO TABS
100.0000 mg | ORAL_TABLET | Freq: Every day | ORAL | Status: DC
  Administered 2022-11-04 – 2022-11-08 (×5): 100 mg via ORAL
  Filled 2022-11-03 (×6): qty 1

## 2022-11-03 MED ORDER — HYDROXYZINE HCL 25 MG PO TABS
25.0000 mg | ORAL_TABLET | Freq: Four times a day (QID) | ORAL | Status: AC | PRN
  Administered 2022-11-03 – 2022-11-05 (×5): 25 mg via ORAL
  Filled 2022-11-03 (×5): qty 1

## 2022-11-03 MED ORDER — QUETIAPINE FUMARATE 50 MG PO TABS
50.0000 mg | ORAL_TABLET | Freq: Every day | ORAL | Status: DC
  Administered 2022-11-03: 50 mg via ORAL
  Filled 2022-11-03 (×2): qty 1

## 2022-11-03 MED ORDER — ACETAMINOPHEN 325 MG PO TABS
650.0000 mg | ORAL_TABLET | Freq: Four times a day (QID) | ORAL | Status: DC | PRN
  Administered 2022-11-03 – 2022-11-07 (×5): 650 mg via ORAL
  Filled 2022-11-03 (×5): qty 2

## 2022-11-03 MED ORDER — ONDANSETRON 4 MG PO TBDP: 4.0000 mg | ORAL_TABLET | Freq: Four times a day (QID) | ORAL | Status: AC | PRN

## 2022-11-03 MED ORDER — ALUM & MAG HYDROXIDE-SIMETH 200-200-20 MG/5ML PO SUSP: 30.0000 mL | ORAL | Status: DC | PRN

## 2022-11-03 MED ORDER — ADULT MULTIVITAMIN W/MINERALS CH
1.0000 | ORAL_TABLET | Freq: Every day | ORAL | Status: DC
  Administered 2022-11-03 – 2022-11-08 (×6): 1 via ORAL
  Filled 2022-11-03 (×8): qty 1

## 2022-11-03 MED ORDER — LOPERAMIDE HCL 2 MG PO CAPS: 2.0000 mg | ORAL_CAPSULE | ORAL | Status: AC | PRN

## 2022-11-03 MED ORDER — NICOTINE 14 MG/24HR TD PT24
14.0000 mg | MEDICATED_PATCH | Freq: Every day | TRANSDERMAL | Status: DC
  Administered 2022-11-03 – 2022-11-08 (×6): 14 mg via TRANSDERMAL
  Filled 2022-11-03 (×9): qty 1
  Filled 2022-11-03: qty 14

## 2022-11-03 MED ORDER — LORAZEPAM 1 MG PO TABS
1.0000 mg | ORAL_TABLET | Freq: Four times a day (QID) | ORAL | Status: AC | PRN
  Administered 2022-11-05: 1 mg via ORAL
  Filled 2022-11-03: qty 1

## 2022-11-03 MED ORDER — HYDROXYZINE HCL 50 MG PO TABS
50.0000 mg | ORAL_TABLET | Freq: Once | ORAL | Status: AC
  Administered 2022-11-03: 50 mg via ORAL
  Filled 2022-11-03 (×2): qty 1

## 2022-11-03 MED ORDER — FLUOXETINE HCL 10 MG PO CAPS
10.0000 mg | ORAL_CAPSULE | Freq: Every day | ORAL | Status: DC
  Administered 2022-11-03 – 2022-11-04 (×2): 10 mg via ORAL
  Filled 2022-11-03 (×4): qty 1

## 2022-11-03 MED ORDER — MAGNESIUM HYDROXIDE 400 MG/5ML PO SUSP: 30.0000 mL | Freq: Every day | ORAL | Status: DC | PRN

## 2022-11-03 NOTE — Plan of Care (Signed)
  Problem: Education: Goal: Knowledge of the prescribed therapeutic regimen will improve Outcome: Progressing   Problem: Education: Goal: Ability to make informed decisions regarding treatment will improve Outcome: Progressing

## 2022-11-03 NOTE — Progress Notes (Signed)
Krayton Wortley is a 42 y.o. male voluntarily admitted for substance abuse and  suicide ideation with a plan to jump off the bridge. Pt stated he lost his mom and brother 3 years ago, his dad is not in his life. 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 Pt is not sure if he will go back there. Pt is worried that at 69, he has nothing to show. Pt has been calm, cooperative and oriented x 4. Pt denied SI/HI, AVH at the time of admission and contracted for safety. Consents signed, skin/belongings search completed and pt oriented to the unit. Pt stable at this time. Pt given the opportunity to express concerns and ask questions. Pt given toiletries. Will continue to monitor.

## 2022-11-03 NOTE — ED Notes (Signed)
Attempted report x1. RN stated to call back in few minutes.

## 2022-11-03 NOTE — H&P (Addendum)
Psychiatric Admission Assessment Adult  Patient Identification: Christian Andrews MRN:  [id number] Date of Evaluation:  11/03/2022 Chief Complaint:  MDD (major depressive disorder), recurrent severe, without psychosis (HCC) [F33.2] Principal Diagnosis: MDD (major depressive disorder), recurrent severe, without psychosis (HCC) Diagnosis:  Principal Problem:   MDD (major depressive disorder), recurrent severe, without psychosis (HCC) Active Problems:   Generalized anxiety disorder   Stimulant use disorder   Alcohol use disorder  CC: "I did not feel safe where I lived". Reason For Admission: Christian Andrews is a 42 yo Caucasian male with a reported past mental health history of MDD and GAD who [facility] the [place] [facility] with complaints of worsening depressive symptoms and suicidal ideations with a plan to jump off a bridge.  Patient was transferred voluntarily to this behavioral health Hospital for treatment and stabilization of his mood  Mode of transport to Hospital: Safe transport Current Outpatient (Home) Medication List: None currently as per patient PRN medication prior to evaluation: Hydroxyzine, Milk of Magnesia, trazodone, Maalox, Tylenol as needed.  ED course: Uneventful. Collateral Information: None at this time. POA/Legal Guardian: Patient is own guardian.    HPI: Patient reports that at least for two weeks prior to presenting to the [facility] leading to this hospitalization, he was experiencing decreased concentration levels, poor sleep, decreased appetite, decreased energy levels, decreased interest in activities that he typically enjoys to do, psychomotor slowing, as well as feelings of hopelessness, helplessness and worthlessness. Pt reports that he is currently still experiencing the above symptoms as well as feelings of guilt about "being 42 years old and nothing to show for". Pt reports feelings of frustration with his life so far, which led to  intrusive thoughts of death, and plans to jump off the bridge to complete this.  Pt reports current stressors as being multiple socioeconomic stressors. He reports the death of his brother and mother 1.5 yrs ago as still being very bothersome to him, and states that he has not fully dealt with this. He shares that his mother suffered from COPD and he was traumatized from having to perform CPR on her several times, and when she finally passed away, his brother completely suicide 3 days later via overdose. He states that his brother was a heroine addict, and was very depressed, and also had a history of bipolar d/o.   Pt reports financial stressors as being another problem for him, and states that he currently works as a Production assistant, radio at [employer]", lives with a friend, owes someone money, is currently paying back, and is unable to afford his own place, as he does not make enough money. Pt states that he is currently struggling so safe money to get his own place. Pt also states that he does currently have his own means of transportation which makes it difficult for him to go to this mental health appointments.   Pt reports some manic type symptoms, and states that he has had periods within the past couple of months where for at least 7 days in a row, he functioned on very limited sleep, ate very little, but had no appetite and no need for sleep, indulged in risk taking behaviors, and extremely elevated energy levels, but with all of the above being in the context of cocaine use. It is difficult to ascertain if this is true mania related to bipolar d/o, but pt has a strong family history of bipolar d/o in his mother and brother.  Pt denies having  obsessions, denies compulsions in the past or currently, denies physical/emotional/sexual abuse in the past/currently. He denies head trauma, concussions, seizures in the past or currently. Denies psychosis in the past or currently. He reports worsening anxiety,  feelings of restlessness and panic attacks which have been more recurrent recently. He denies self injurious behaviors in the past or currently.  Past Psychiatric Hx: Previous Psych Diagnoses: MDD, GAD /Prior inpatient treatment:[facility]/26/[facility] Current/prior outpatient treatment: [place] [facility], but states that he went there and got a starter pack of medications, and did not follow up once be ran out of those medications.  Prior rehab ZO:XWRUEAhx:Denies  Psychotherapy VW:UJWJXBhx:Denies, but states will need therapy to help with the loss of his mother and brother.  History of suicide attempt: Cuts wrists 20 yrs ago, overdosed on pills at ages 722-23 yrs old.  History of homicide or aggression: Denies Psychiatric medication history: Prozac, Seroquel showed signs of being helpful in the past, but he did not continue taking them. These meds were started during the [facility] hospitalization in 04/2022 as per patient.  Psychiatric medication compliance history: non compliance Neuromodulation history: denies  Current Psychiatrist: none Current therapist: none  Substance Abuse Hx: Alcohol: Started drinking at age 89 yrs old. Currently drinks 6-7 beers every other day. Denies h/o blackouts, denies h/o seizures r/t etoh use. Tobacco: 1 pack lasts 3 days Illicit drugs: Cocaine use started at age 89 yrs old.Now uses every 3 days, uses $ 80 worth per week. Denies any other substance use. Rx drug abuse: Denies  Rehab hx: Denies  Past Medical History: Medical Diagnoses:Denies Home JY:NWGNFARx:Denies  Prior Hosp:Denies Prior Surgeries/Trauma:Denies Head trauma, LOC, concussions, seizures: Denies Allergies:Denies LMP:n/a Contraception:n/a OZH:YQMVHQPCP:denies having one  Family History: Medical:copd in mother Psych:bipolar d/o in mother and brother Psych IO:NGEXBMRx:unsure SA/HA: Brother completed suicide Substance use family hx: Brother was heroine  addict  Social History: Pt reports highest level of education as being some college, reports being single, heterosexual, only sibling is deceased, mother deceased, father is living but they see each other about 3 times a year, and he lives in "[place], [place] Pt denies having a support system currently, lives with a friend who is not supportive. Has no children. Has no stable housing. Legal:Denies Military: Denies  Pt with flat affect and depressed mood, attention to personal hygiene and grooming is fair, eye contact is good, speech is clear & coherent. Thought contents are organized and logical, and pt currently presents with passive SI, verbally contracts for safety on the unit. Denies HI/AVH or paranoia. There is no evidence of delusional thoughts.    Associated Signs/Symptoms: Depression Symptoms:  depressed mood, anhedonia, insomnia, psychomotor retardation, fatigue, feelings of worthlessness/guilt, difficulty concentrating, hopelessness, recurrent thoughts of death, (Hypo) Manic Symptoms:  Irritable Mood, Anxiety Symptoms:  Excessive Worry, Psychotic Symptoms:   na PTSD Symptoms: NA Total Time spent with patient: 1.5 hours  Past Psychiatric History: MDD, GAD  Is the patient at risk to self? Yes.    Has the patient been a risk to self in the past 6 months? Yes.    Has the patient been a risk to self within the distant past? Yes.    Is the patient a risk to others? No.  Has the patient been a risk to others in the past 6 months? No.  Has the patient been a risk to others within the distant past? No.   [place] Scale:  Flowsheet  Row Admission (Current) from 11/03/2022 in [place] [facility] ED from 11/02/2022 in [facility] Admission (Discharged) from 04/26/2022 in [place] [facility]  C-SSRS RISK CATEGORY No Risk High Risk Moderate Risk       Alcohol Screening: 1. How often do you have a drink  containing alcohol?: 4 or more times a week 2. How many drinks containing alcohol do you have on a typical day when you are drinking?: 5 or 6 3. How often do you have six or more drinks on one occasion?: Monthly AUDIT-C Score: 8 4. How often during the last year have you found that you were not able to stop drinking once you had started?: Weekly 5. How often during the last year have you failed to do what was normally expected from you because of drinking?: Weekly 6. How often during the last year have you needed a first drink in the morning to get yourself going after a heavy drinking session?: Weekly 7. How often during the last year have you had a feeling of guilt of remorse after drinking?: Daily or almost daily 8. How often during the last year have you been unable to remember what happened the night before because you had been drinking?: Monthly 9. Have you or someone else been injured as a result of your drinking?: No 10. Has a relative or friend or a doctor or another health worker been concerned about your drinking or suggested you cut down?: Yes, during the last year Alcohol Use Disorder Identification Test Final Score (AUDIT): 27 Alcohol Brief Interventions/Follow-up: Alcohol education/Brief advice Substance Abuse History in the last 12 months:  Yes.   Consequences of Substance Abuse: Worsening of depressive symptoms Previous Psychotropic Medications: Yes  Psychological Evaluations: No  Past Medical History: History reviewed. No pertinent past medical history.  Past Surgical History:  Procedure Laterality Date   TONSILECTOMY/ADENOIDECTOMY WITH MYRINGOTOMY Bilateral 1997   Family History: History reviewed. No pertinent family history. Family Psychiatric  History: As above Tobacco Screening:  Social History   Tobacco Use  Smoking Status Every Day   Packs/day: 0.75   Types: Cigarettes  Smokeless Tobacco Never    BH Tobacco Counseling     Are you interested in Tobacco  Cessation Medications?  Yes, implement Nicotene Replacement Protocol Counseled patient on smoking cessation:  Yes Reason Tobacco Screening Not Completed: Patient Refused Screening       Social History:  Social History   Substance and Sexual Activity  Alcohol Use Yes     Social History   Substance and Sexual Activity  Drug Use Yes   Types: Cocaine    Additional Social History: Marital status: Single What is your sexual orientation?: Heterosexual Has your sexual activity been affected by drugs, alcohol, medication, or emotional stress?: No Does patient have children?: No    Allergies:  No Known Allergies Lab Results:  Results for orders placed or performed during the hospital encounter of 11/02/22 (from the past 48 hour(s))  Resp panel by RT-PCR (RSV, Flu A&B, Covid) Anterior Nasal Swab     Status: None   Collection Time: 11/02/22  7:42 PM   Specimen: Anterior Nasal Swab  Result Value Ref Range   SARS Coronavirus 2 by RT PCR NEGATIVE NEGATIVE    Comment: (NOTE) SARS-CoV-2 target nucleic acids are NOT DETECTED.  The SARS-CoV-2 RNA is generally detectable in upper respiratory specimens during the acute phase of infection. The lowest concentration of SARS-CoV-2 viral copies  this assay can detect is 138 copies/mL. A negative result does not preclude SARS-Cov-2 infection and should not be used as the sole basis for treatment or other patient management decisions. A negative result may occur with  improper specimen collection/handling, submission of specimen other than nasopharyngeal swab, presence of viral mutation(s) within the areas targeted by this assay, and inadequate number of viral copies(<138 copies/mL). A negative result must be combined with clinical observations, patient history, and epidemiological information. The expected result is Negative.  Fact Sheet for Patients:  BloggerCourse.com  Fact Sheet for Healthcare Providers:   SeriousBroker.it  This test is no t yet approved or cleared by the [place] FDA and  has been authorized for detection and/or diagnosis of SARS-CoV-2 by FDA under an Emergency Use Authorization (EUA). This EUA will remain  in effect (meaning this test can be used) for the duration of the COVID-19 declaration under Section 564(b)(1) of the Act, 21 U.S.C.section 360bbb-3(b)(1), unless the authorization is terminated  or revoked sooner.       Influenza A by PCR NEGATIVE NEGATIVE   Influenza B by PCR NEGATIVE NEGATIVE    Comment: (NOTE) The Xpert Xpress SARS-CoV-2/FLU/RSV plus assay is intended as an aid in the diagnosis of influenza from Nasopharyngeal swab specimens and should not be used as a sole basis for treatment. Nasal washings and aspirates are unacceptable for Xpert Xpress SARS-CoV-2/FLU/RSV testing.  Fact Sheet for Patients: BloggerCourse.com  Fact Sheet for Healthcare Providers: SeriousBroker.it  This test is not yet approved or cleared by the [place] FDA and has been authorized for detection and/or diagnosis of SARS-CoV-2 by FDA under an Emergency Use Authorization (EUA). This EUA will remain in effect (meaning this test can be used) for the duration of the COVID-19 declaration under Section 564(b)(1) of the Act, 21 U.S.C. section 360bbb-3(b)(1), unless the authorization is terminated or revoked.     Resp Syncytial Virus by PCR NEGATIVE NEGATIVE    Comment: (NOTE) Fact Sheet for Patients: BloggerCourse.com  Fact Sheet for Healthcare Providers: SeriousBroker.it  This test is not yet approved or cleared by the [place] FDA and has been authorized for detection and/or diagnosis of SARS-CoV-2 by FDA under an Emergency Use Authorization (EUA). This EUA will remain in effect (meaning this test can be used) for the duration of  the COVID-19 declaration under Section 564(b)(1) of the Act, 21 U.S.C. section 360bbb-3(b)(1), unless the authorization is terminated or revoked.  Performed at [facility], [address] [place], [place] [address]   POCT Urine Drug Screen - (I-Screen)     Status: Abnormal   Collection Time: 11/02/22  7:42 PM  Result Value Ref Range   POC Amphetamine UR None Detected NONE DETECTED (Cut Off Level 1000 ng/mL)   POC Secobarbital (BAR) None Detected NONE DETECTED (Cut Off Level 300 ng/mL)   POC Buprenorphine (BUP) None Detected NONE DETECTED (Cut Off Level 10 ng/mL)   POC Oxazepam (BZO) None Detected NONE DETECTED (Cut Off Level 300 ng/mL)   POC Cocaine UR Positive (A) NONE DETECTED (Cut Off Level 300 ng/mL)   POC Methamphetamine UR None Detected NONE DETECTED (Cut Off Level 1000 ng/mL)   POC Morphine None Detected NONE DETECTED (Cut Off Level 300 ng/mL)   POC Methadone UR None Detected NONE DETECTED (Cut Off Level 300 ng/mL)   POC Oxycodone UR None Detected NONE DETECTED (Cut Off Level 100 ng/mL)   POC Marijuana UR Positive (A) NONE DETECTED (Cut Off Level 50 ng/mL)  CBC  with Differential/Platelet     Status: None   Collection Time: 11/02/22  7:43 PM  Result Value Ref Range   WBC 8.9 4.0 - 10.5 K/uL   RBC 4.74 4.22 - 5.81 MIL/uL   Hemoglobin 16.1 13.0 - 17.0 g/dL   HCT 46.0 39.0 - 52.0 %   MCV 97.0 80.0 - 100.0 fL   MCH 34.0 26.0 - 34.0 pg   MCHC 35.0 30.0 - 36.0 g/dL   RDW 14.3 11.5 - 15.5 %   Platelets 383 150 - 400 K/uL   nRBC 0.0 0.0 - 0.2 %   Neutrophils Relative % 72 %   Neutro Abs 6.4 1.7 - 7.7 K/uL   Lymphocytes Relative 19 %   Lymphs Abs 1.7 0.7 - 4.0 K/uL   Monocytes Relative 6 %   Monocytes Absolute 0.5 0.1 - 1.0 K/uL   Eosinophils Relative 2 %   Eosinophils Absolute 0.2 0.0 - 0.5 K/uL   Basophils Relative 1 %   Basophils Absolute 0.1 0.0 - 0.1 K/uL   Immature Granulocytes 0 %   Abs Immature Granulocytes 0.04 0.00 - 0.07 K/uL    Comment: Performed at  [facility], [address], [place], [place] [address]  Comprehensive metabolic panel     Status: Abnormal   Collection Time: 11/02/22  7:43 PM  Result Value Ref Range   Sodium 132 (L) 135 - 145 mmol/L   Potassium 4.2 3.5 - 5.1 mmol/L   Chloride 91 (L) 98 - 111 mmol/L   CO2 27 22 - 32 mmol/L   Glucose, Bld 65 (L) 70 - 99 mg/dL    Comment: Glucose reference range applies only to samples taken after fasting for at least 8 hours.   BUN 9 6 - 20 mg/dL   Creatinine, Ser 0.90 0.61 - 1.24 mg/dL   Calcium 9.6 8.9 - 10.3 mg/dL   Total Protein 8.0 6.5 - 8.1 g/dL   Albumin 5.0 3.5 - 5.0 g/dL   AST 77 (H) 15 - 41 U/L   ALT 44 0 - 44 U/L   Alkaline Phosphatase 49 38 - 126 U/L   Total Bilirubin 2.1 (H) 0.3 - 1.2 mg/dL   GFR, Estimated >60 >60 mL/min    Comment: (NOTE) Calculated using the CKD-EPI Creatinine Equation (2021)    Anion gap 14 5 - 15    Comment: Performed at [facility] [address] [place], [place] [address]  Magnesium     Status: None   Collection Time: 11/02/22  7:43 PM  Result Value Ref Range   Magnesium 2.2 1.7 - 2.4 mg/dL    Comment: Performed at [facility], [place] [address] [place], [place] [address]  Hemoglobin A1c     Status: None   Collection Time: 11/02/22  7:43 PM  Result Value Ref Range   Hgb A1c MFr Bld 5.0 4.8 - 5.6 %    Comment: (NOTE) Pre diabetes:          5.7%-6.4%  Diabetes:              >6.4%  Glycemic control for   <7.0% adults with diabetes    Mean Plasma Glucose 96.8 mg/dL    Comment: Performed at [facility] [address], [place], [place] [address]  Ethanol     Status: None   Collection Time: 11/02/22  7:43 PM  Result Value Ref Range   Alcohol, Ethyl (B) <10 <10 mg/dL    Comment: (  NOTE) Lowest detectable limit for serum alcohol is 10 mg/dL.  For medical purposes only. Performed at [facility], [address] [place], [place] [address]   Lipid panel     Status: Abnormal   Collection Time:  11/02/22  7:43 PM  Result Value Ref Range   Cholesterol 237 (H) 0 - 200 mg/dL   Triglycerides 621157 (H) <150 mg/dL   HDL >308>135 >65>40 mg/dL   Total CHOL/HDL Ratio NOT CALCULATED RATIO   VLDL 31 0 - 40 mg/dL   LDL Cholesterol NOT CALCULATED 0 - 99 mg/dL    Comment: Performed at [facility], [address] [place], [place] [address]  TSH     Status: None   Collection Time: 11/02/22  7:43 PM  Result Value Ref Range   TSH 1.563 0.350 - 4.500 uIU/mL    Comment: Performed by a 3rd Generation assay with a functional sensitivity of <=0.01 uIU/mL. Performed at [facility], [address] [place], [place] [address]   POC SARS Coronavirus 2 Ag     Status: None   Collection Time: 11/02/22  7:55 PM  Result Value Ref Range   SARSCOV2ONAVIRUS 2 AG NEGATIVE NEGATIVE    Comment: (NOTE) SARS-CoV-2 antigen NOT DETECTED.   Negative results are presumptive.  Negative results do not preclude SARS-CoV-2 infection and should not be used as the sole basis for treatment or other patient management decisions, including infection  control decisions, particularly in the presence of clinical signs and  symptoms consistent with COVID-19, or in those who have been in contact with the virus.  Negative results must be combined with clinical observations, patient history, and epidemiological information. The expected result is Negative.  Fact Sheet for Patients: https://www.jennings-kim.com/https://www.fda.gov/media/141569/download  Fact Sheet for Healthcare Providers: https://alexander-rogers.biz/https://www.fda.gov/media/141568/download  This test is not yet approved or cleared by the [place] FDA and  has been authorized for detection and/or diagnosis of SARS-CoV-2 by FDA under an Emergency Use Authorization (EUA).  This EUA will remain in effect (meaning this test can be used) for the duration of  the COV ID-19 declaration under Section 564(b)(1) of the Act, 21 U.S.C. section 360bbb-3(b)(1), unless the authorization is terminated or revoked  sooner.      Blood Alcohol level:  Lab Results  Component Value Date   ETH <10 11/02/2022   ETH <10 04/25/2022    Metabolic Disorder Labs:  Lab Results  Component Value Date   HGBA1C 5.0 11/02/2022   MPG 96.8 11/02/2022   MPG 116.89 04/25/2022   No results found for: "PROLACTIN" Lab Results  Component Value Date   CHOL 237 (H) 11/02/2022   TRIG 157 (H) 11/02/2022   HDL >135 11/02/2022   CHOLHDL NOT CALCULATED 11/02/2022   VLDL 31 11/02/2022   LDLCALC NOT CALCULATED 11/02/2022   LDLCALC 109 (H) 04/25/2022    Current Medications: Current Facility-Administered Medications  Medication Dose Route Frequency Provider Last Rate Last Admin   acetaminophen (TYLENOL) tablet 650 mg  650 mg Oral Q6H PRN Onuoha, Chinwendu V, NP   650 mg at 11/03/22 0835   alum & mag hydroxide-simeth (MAALOX/MYLANTA) 200-200-20 MG/5ML suspension 30 mL  30 mL Oral Q4H PRN Onuoha, Chinwendu V, NP       FLUoxetine (PROZAC) capsule 10 mg  10 mg Oral Daily Onuoha, Chinwendu V, NP   10 mg at 11/03/22 0835   hydrOXYzine (ATARAX) tablet 25 mg  25 mg Oral Q6H PRN Onuoha, Chinwendu V, NP   25  mg at 11/03/22 1539   loperamide (IMODIUM) capsule 2-4 mg  2-4 mg Oral PRN Onuoha, Chinwendu V, NP       LORazepam (ATIVAN) tablet 1 mg  1 mg Oral Q6H PRN Onuoha, Chinwendu V, NP       magnesium hydroxide (MILK OF MAGNESIA) suspension 30 mL  30 mL Oral Daily PRN Onuoha, Chinwendu V, NP       multivitamin with minerals tablet 1 tablet  1 tablet Oral Daily Onuoha, Chinwendu V, NP   1 tablet at 11/03/22 0835   nicotine (NICODERM CQ - dosed in mg/24 hours) patch 14 mg  14 mg Transdermal Daily Starleen Blue, NP   14 mg at 11/03/22 1557   ondansetron (ZOFRAN-ODT) disintegrating tablet 4 mg  4 mg Oral Q6H PRN Onuoha, Chinwendu V, NP       QUEtiapine (SEROQUEL) tablet 50 mg  50 mg Oral QHS Onuoha, Chinwendu V, NP       [START ON 11/04/2022] thiamine (Vitamin B-1) tablet 100 mg  100 mg Oral Daily Onuoha, Chinwendu V, NP       PTA  Medications: Medications Prior to Admission  Medication Sig Dispense Refill Last Dose   Hydrocortisone (CORTIZONE-10 EX) Apply 1 Application topically daily as needed (For rash).   3 weeks ago   ibuprofen (ADVIL) 200 MG tablet Take 800 mg by mouth every 6 (six) hours as needed (For tooth pain).   11/02/2022   FLUoxetine (PROZAC) 10 MG capsule Take 1 capsule (10 mg total) by mouth daily. (Patient not taking: Reported on 11/03/2022) 30 capsule 0 Not Taking   QUEtiapine (SEROQUEL) 50 MG tablet Take 1 tablet (50 mg total) by mouth at bedtime. (Patient not taking: Reported on 11/03/2022) 30 tablet 1 Not Taking   Musculoskeletal: Strength & Muscle Tone: within normal limits Gait & Station: normal Patient leans: N/A  Psychiatric Specialty Exam:  Presentation  General Appearance:  Fairly Groomed  Eye Contact: Fair  Speech: Clear and Coherent  Speech Volume: Normal  Handedness: Right   Mood and Affect  Mood: Anxious; Depressed  Affect: Congruent   Thought Process  Thought Processes: Coherent  Duration of Psychotic Symptoms:N/A Past Diagnosis of Schizophrenia or Psychoactive disorder: No  Descriptions of Associations:Intact  Orientation:Full (Time, Place and Person)  Thought Content:Logical  Hallucinations:Hallucinations: None  Ideas of Reference:None  Suicidal Thoughts:Suicidal Thoughts: No SI Active Intent and/or Plan: Without Intent  Homicidal Thoughts:Homicidal Thoughts: No   Sensorium  Memory: Immediate Good  Judgment: Fair  Insight: Fair   Chartered certified accountant: Fair  Attention Span: Fair  Recall: [employer] of Knowledge: Fair  Language: Fair   Psychomotor Activity  Psychomotor Activity: Psychomotor Activity: Normal   Assets  Assets: Communication Skills   Sleep  Sleep: Sleep: Poor    Physical Exam: Physical Exam Constitutional:      Appearance: Normal appearance.  HENT:     Head: Normocephalic.   Pulmonary:     Effort: Pulmonary effort is normal.  Musculoskeletal:        General: Normal range of motion.  Neurological:     Mental Status: He is alert and oriented to person, place, and time.    Review of Systems  Constitutional:  Negative for fever.  HENT: Negative.    Eyes: Negative.   Respiratory: Negative.    Cardiovascular: Negative.   Gastrointestinal: Negative.   Genitourinary: Negative.   Musculoskeletal: Negative.   Skin: Negative.   Neurological:  Negative for dizziness.  Psychiatric/Behavioral:  Positive for  depression and substance abuse. Negative for hallucinations and suicidal ideas. The patient is nervous/anxious and has insomnia.    Blood pressure (!) 140/79, pulse 93, temperature 98.4 F (36.9 C), temperature source Oral, resp. rate 16, height 6\' 2"  (1.88 m), weight 72 kg, SpO2 100 %. Body mass index is 20.38 kg/m.  Treatment Plan Summary: Daily contact with patient to assess and evaluate symptoms and progress in treatment and Medication management  Observation Level/Precautions:  15 minute checks  Laboratory:  Labs reviewed   Psychotherapy:  Unit Group sessions  Medications:  See [facility]  Consultations:  To be determined   Discharge Concerns:  Safety, medication compliance, mood stability  Estimated LOS: 5-7 days  Other:  N/A   Labs independently reviewed on 11/03/2022: Lipid panel with cholesterol of 237, triglycerides of 157, educated on healthy food choices and exercise. 11/05/2022. HA1C-WNL. CBC WNL. CMP with Na slightly low at 132, otherwise, WNL. Tox screen with +cocaine and +THC. EKG with QtC-425. Orders placed for Vitamin B1, Vitamin B12, Vitamin D levels and baseline ua.  PLAN Safety and Monitoring: Voluntary admission to inpatient psychiatric unit for safety, stabilization and treatment Daily contact with patient to assess and evaluate symptoms and progress in treatment Patient's case to be discussed in multi-disciplinary team  meeting Observation Level : q15 minute checks Vital signs: q12 hours Precautions:Safety  Long Term Goal(s): Improvement in symptoms so as ready for discharge  Short Term Goals: Ability to identify changes in lifestyle to reduce recurrence of condition will improve, Ability to disclose and discuss suicidal ideas, Ability to identify and develop effective coping behaviors will improve, Compliance with prescribed medications will improve, and Ability to identify triggers associated with substance abuse/mental health issues will improve  Diagnoses  Principal Problem:   MDD (major depressive disorder), recurrent severe, without psychosis (HCC) Active Problems:   Generalized anxiety disorder   Stimulant use disorder   Alcohol use disorder -Continue Prozac 10 mg daily for GAD/MDD -Continue Seroquel 50 mg nightly for sleep/mood stabilization -Continue Hydroxyzine 25 mg every 6 hours PRN for anxiety -Continue Ativan 1mg  tabs every 6 hours PRN for CIWA>10  Other PRNS -Continue Tylenol 650 mg every 6 hours PRN for mild pain -Continue Maalox 30 mg every 4 hrs PRN for indigestion -Continue Imodium 2-4 mg as needed for diarrhea -Continue Milk of Magnesia as needed every 6 hrs for constipation -Continue Zofran disintegrating tabs every 6 hrs PRN for nausea   Discharge Planning: Social work and case management to assist with discharge planning and identification of hospital follow-up needs prior to discharge Estimated LOS: 5-7 days Discharge Concerns: Need to establish a safety plan; Medication compliance and effectiveness Discharge Goals: Return home with outpatient referrals for mental health follow-up including medication management/psychotherapy  I certify that inpatient services furnished can reasonably be expected to improve the patient's condition.    OYD:[id number], NP 1/19/20248:51 PM

## 2022-11-03 NOTE — Group Note (Signed)
Recreation Therapy Group Note   Group Topic:Stress Management  Group Date: 11/03/2022 Start Time: 0945 End Time: 1003 Facilitators: Dacy Enrico-McCall, LRT,CTRS Location: [address] Dayroom   Goal Area(s) Addresses:  Patient will actively participate in stress management techniques presented during session.  Patient will successfully identify benefit of practicing stress management post d/c.    Group Description: Guided Imagery. LRT provided education, instruction, and demonstration on practice of visualization via guided imagery. Patient was asked to participate in the technique introduced during session. LRT read a script that focused on visualizing a peaceful place that helps them relax, decompress and get away mentally.   LRT debriefed including topics of mindfulness, stress management and specific scenarios each patient could use these techniques. Patients were given suggestions of ways to access scripts post d/c and encouraged to explore Youtube and other apps available on smartphones, tablets, and computers.   Affect/Mood: N/A   Participation Level: Did not attend    Clinical Observations/Individualized Feedback:     Plan: Continue to engage patient in RT group sessions 2-3x/week.   Christian Andrews, LRT,CTRS 11/03/2022 11:47 AM

## 2022-11-03 NOTE — ED Notes (Signed)
Report given to Opal Sidles, RN at [facility]. Pending safe transport.

## 2022-11-03 NOTE — BHH Group Notes (Signed)
Pt attended AA meeting.  

## 2022-11-03 NOTE — BHH Counselor (Signed)
Adult Comprehensive Assessment  Patient ID: Kanye Depree, male   DOB: 08-17-1981, 42 y.o.   MRN: [id number]  Information Source: Information source: Patient  Current Stressors:  Patient states their primary concerns and needs for treatment are:: " majorly depressed, don't know what is next or where to go within my life, toxic roommate situation and just loss my mom and brother" Patient states their goals for this hospitilization and ongoing recovery are:: "Find somewhere else to live and seek therapy/med management" Educational / Learning stressors: "No" Employment / Job issues: "Not making enough money, when work is slow" Family Relationships: "NoPublishing copy / Lack of resources (include bankruptcy): "Don't have transportation and when business is slow at 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 / Lack of housing: " very toxic environment I get put out when my other two roommates are arguing" Physical health (include injuries & life threatening diseases): "No" Social relationships: "No" Substance abuse: "No" Bereavement / Loss: " loss of my mom and brother"  Living/Environment/Situation:  Living Arrangements: Non-relatives/Friends Living conditions (as described by patient or guardian): "toxic" Who else lives in the home?: "two other roommates" How long has patient lived in current situation?: " September to November of 2023" What is atmosphere in current home: Chaotic, Temporary  Family History:  Marital status: Single What is your sexual orientation?: Heterosexual Has your sexual activity been affected by drugs, alcohol, medication, or emotional stress?: No Does patient have children?: No  Childhood History:  By whom was/is the patient raised?: Mother Additional childhood history information: N/A Description of patient's relationship with caregiver when they were a child: "great" Patient's description of current relationship with people who raised him/her: Mom and brother  are deceased How were you disciplined when you got in trouble as a child/adolescent?: " getting embarrassed" Does patient have siblings?: Yes Number of Siblings: 1 Description of patient's current relationship with siblings: Brother is deceased Did patient suffer any verbal/emotional/physical/sexual abuse as a child?: No Did patient suffer from severe childhood neglect?: No Has patient ever been sexually abused/assaulted/raped as an adolescent or adult?: No Was the patient ever a victim of a crime or a disaster?: No Witnessed domestic violence?: No Has patient been affected by domestic violence as an adult?: No  Education:  Highest grade of school patient has completed: Automotive engineer Currently a Ship broker?: No Learning disability?: No  Employment/Work Situation:   Employment Situation: Employed Where is Patient Currently Employed?: [place] has Patient Been Employed?: " 8 years " Are You Satisfied With Your Job?: Yes Do You Work More Than One Job?: No Work Stressors: none Patient's Job has Been Impacted by Current Illness: No What is the Longest Time Patient has Held a Job?: 10 years Where was the Patient Employed at that Time?: [employer] Has Patient ever Been in the [employer]?: No  Financial Resources:   Financial resources: Income from employment, Private insurance Does patient have a representative payee or guardian?: No  Alcohol/Substance Abuse:   What has been your use of drugs/alcohol within the last 12 months?: " Alcohol every other day" If attempted suicide, did drugs/alcohol play a role in this?: Yes If yes, describe treatment: N/A Has alcohol/substance abuse ever caused legal problems?: No  Social Support System:   Pensions consultant Support System: Fair Astronomer System: " My friends" Type of faith/religion: " I got faith but do not go to church" How does patient's faith help to cope with current illness?: "  No"  Leisure/Recreation:   Do You Have Hobbies?: Yes Leisure and Hobbies: Playing pool, walking outside, and reading  Strengths/Needs:   What is the patient's perception of their strengths?: " I am a people person and I think I am funny" Patient states they can use these personal strengths during their treatment to contribute to their recovery: "getting help to deal with my depression" Patient states these barriers may affect/interfere with their treatment: "No" Patient states these barriers may affect their return to the community: "No" Other important information patient would like considered in planning for their treatment: N/A  Discharge Plan:   Currently receiving community mental health services: No Patient states concerns and preferences for aftercare planning are: N/A Patient states they will know when they are safe and ready for discharge when: N/A Does patient have access to transportation?: No Does patient have financial barriers related to discharge medications?: No Plan for no access to transportation at discharge: CSW will get patient a Taxi Plan for living situation after discharge: Patient has interest in a [place] or boarding house, does not want to go back to the toxic environment Will patient be returning to same living situation after discharge?: No  Summary/Recommendations:   Summary and Recommendations (to be completed by the evaluator): Sabre is a 42 year old male who voluntary came to hospital and was admitted due to the severity of his depression and thoughts of suicide. Leiby psychiatric diagnosis's are MDD, GAD, alcohol use disorder and stimulant disorder. Renso states that his main stressors are his roommates ( they are always arguing ), finances, and the loss of his brother and mom that he is still dealing with. Nuchem is not connected with anyone in the community but would like to seek therapy and med management. Antwan has a past history of 3 previous  inpatient psychiatric admissions. Obie requested for a homeless packet with a list of [place], so he can find housing elsewhere and be away from the toxic living and stop drinking because he notice that it has started back.While here, Ercole Georg can benefit from crisis stabilization, medication management, therapeutic milieu, and referrals for services.   Sherre Lain. 11/03/2022

## 2022-11-03 NOTE — Tx Team (Signed)
Initial Treatment Plan 11/03/2022 6:25 AM Margaretha Seeds BMW:[id number]    PATIENT STRESSORS: Loss of mother and brother through death.   Marital or family conflict   Occupational concerns   Substance abuse     PATIENT STRENGTHS: Armed forces logistics/support/administrative officer  Physical Health  Work skills    PATIENT IDENTIFIED PROBLEMS: Suicide   Depression  Anxiety  "Figure out my medicine"  " Look for the opportunities"             DISCHARGE CRITERIA:  Ability to meet basic life and health needs Adequate post-discharge living arrangements Improved stabilization in mood, thinking, and/or behavior Medical problems require only outpatient monitoring  PRELIMINARY DISCHARGE PLAN: Attend aftercare/continuing care group Attend PHP/IOP Attend 12-step recovery group Return to previous work or school arrangements  PATIENT/FAMILY INVOLVEMENT: This treatment plan has been presented to and reviewed with the patient, Christian Andrews, and/or family member.  The patient and family have been given the opportunity to ask questions and make suggestions.  Wolfgang Phoenix, RN 11/03/2022, 6:25 AM

## 2022-11-03 NOTE — Group Note (Signed)
Date:  11/03/2022 Time:  4:12 PM  Group Topic/Focus:  Recovery Goals:   The focus of this group is to identify appropriate goals for recovery and establish a plan to achieve them.    Participation Level:  Active  Participation Quality:  Appropriate  Affect:  Appropriate  Cognitive:  Appropriate  Insight: Appropriate  Engagement in Group:  Engaged  Modes of Intervention:  Exploration  Additional Comments:   Patient identified  making contact with  someone he cares about when he feels discouraged about his recovery process.   Jerrye Beavers 11/03/2022, 4:12 PM

## 2022-11-03 NOTE — ED Notes (Signed)
Pt sleeping at present, no distress noted.  Monitoring for safety. 

## 2022-11-03 NOTE — Progress Notes (Signed)
11/03/22 2120  Psych Admission Type (Psych Patients Only)  Admission Status Voluntary  Psychosocial Assessment  Patient Complaints Anxiety;Depression;Worthlessness  Loss adjuster, chartered;Anxious;Worried  Affect Depressed;Preoccupied  Theatre stage manager  Appearance/Hygiene Unremarkable  Behavior Characteristics Cooperative  Mood Depressed;Anxious  Thought Process  Coherency Circumstantial  Content Blaming self  Delusions None reported or observed  Perception WDL  Hallucination None reported or observed  Judgment Poor  Confusion None  Danger to Self  Current suicidal ideation? Denies  Agreement Not to Harm Self Yes  Description of Agreement verbal  Danger to Others  Danger to Others None reported or observed

## 2022-11-03 NOTE — BHH Suicide Risk Assessment (Signed)
Suicide Risk Assessment  Admission Assessment    [facility] Admission Suicide Risk Assessment   Nursing information obtained from:  Patient Demographic factors:  Male, Caucasian, Low socioeconomic status Current Mental Status:  NA Loss Factors:  Loss of significant relationship, Financial problems / change in socioeconomic status Historical Factors:  Prior suicide attempts, Family history of suicide Risk Reduction Factors:  Employed  Total Time spent with patient: 1.5 hours Principal Problem: MDD (major depressive disorder), recurrent severe, without psychosis ([place]) Diagnosis:  Principal Problem:   MDD (major depressive disorder), recurrent severe, without psychosis ([place]) Active Problems:   Generalized anxiety disorder   Stimulant use disorder   Alcohol use disorder  Subjective Data: "I did not feel safe where I lived"   Continued Clinical Symptoms: continues to present with passive SI, mood is depressed, significant history of depression, bipolar d/o and a completed suicide in family. Socioeconomic stressors along with family history predispose pt to a higher risk pf suicide. In need of hospitalization for treatment and stabilization of mental status.  Alcohol Use Disorder Identification Test Final Score (AUDIT): 27 The "Alcohol Use Disorders Identification Test", Guidelines for Use in Primary Care, Second Edition.  World Pharmacologist [facility]). Score between 0-7:  no or low risk or alcohol related problems. Score between 8-15:  moderate risk of alcohol related problems. Score between 16-19:  high risk of alcohol related problems. Score 20 or above:  warrants further diagnostic evaluation for alcohol dependence and treatment.  CLINICAL FACTORS:   Depression:   Anhedonia Comorbid alcohol abuse/dependence Hopelessness Insomnia Severe  Musculoskeletal: Strength & Muscle Tone: within normal limits Gait & Station: normal Patient leans: N/A  Psychiatric Specialty  Exam:  Presentation  General Appearance:  Fairly Groomed  Eye Contact: Fair  Speech: Clear and Coherent  Speech Volume: Normal  Handedness: Right   Mood and Affect  Mood: Anxious; Depressed  Affect: Congruent   Thought Process  Thought Processes: Coherent  Descriptions of Associations:Intact  Orientation:Full (Time, Place and Person)  Thought Content:Logical  History of Schizophrenia/Schizoaffective disorder:No  Duration of Psychotic Symptoms:No data recorded Hallucinations:Hallucinations: None  Ideas of Reference:None  Suicidal Thoughts:Suicidal Thoughts: No SI Active Intent and/or Plan: Without Intent  Homicidal Thoughts:Homicidal Thoughts: No   Sensorium  Memory: Immediate Good  Judgment: Fair  Insight: Fair   Materials engineer: Fair  Attention Span: Fair  Recall: [employer] of Knowledge: Fair  Language: Fair   Psychomotor Activity  Psychomotor Activity: Psychomotor Activity: Normal   Assets  Assets: Communication Skills   Sleep  Sleep: Sleep: Poor    Physical Exam: Physical Exam Constitutional:      Appearance: Normal appearance.  Pulmonary:     Effort: Pulmonary effort is normal.  Neurological:     Mental Status: He is oriented to person, place, and time.    Review of Systems  HENT: Negative.    Eyes:  Negative for blurred vision.  Respiratory: Negative.    Cardiovascular: Negative.   Gastrointestinal:  Negative for heartburn and nausea.  Genitourinary: Negative.   Musculoskeletal: Negative.   Skin: Negative.   Neurological: Negative.  Negative for dizziness.  Psychiatric/Behavioral:  Positive for depression, substance abuse and suicidal ideas. Negative for hallucinations and memory loss. The patient is nervous/anxious and has insomnia.    Blood pressure (!) 140/79, pulse 93, temperature 98.4 F (36.9 C), temperature source Oral, resp. rate 16, height 6\' 2"  (1.88 m), weight 72  kg, SpO2 100 %. Body mass index is 20.38 kg/m.  COGNITIVE FEATURES THAT CONTRIBUTE TO RISK:  None    SUICIDE RISK:   Mild:  There are no identifiable suicide plans, no associated intent, mild dysphoria and related symptoms, good self-control (both objective and subjective assessment), few other risk factors, and identifiable protective factors, including available and accessible social support.   PLAN OF CARE: Please See H & P  I certify that inpatient services furnished can reasonably be expected to improve the patient's condition.   Nicholes Rough, NP 11/03/2022, 8:58 PM

## 2022-11-03 NOTE — Progress Notes (Signed)
   11/03/22 1306  Psych Admission Type (Psych Patients Only)  Admission Status Voluntary  Psychosocial Assessment  Patient Complaints Anxiety;Depression;Worthlessness  Loss adjuster, chartered;Anxious;Worried  Affect Depressed;Preoccupied  Theatre stage manager  Appearance/Hygiene Unremarkable  Behavior Characteristics Cooperative  Mood Depressed;Anxious  Aggressive Behavior  Effect No apparent injury  Thought Process  Coherency Circumstantial  Content Blaming self  Delusions None reported or observed  Perception WDL  Hallucination None reported or observed  Judgment Poor  Confusion None  Danger to Self  Current suicidal ideation? Passive  Self-Injurious Behavior No self-injurious ideation or behavior indicators observed or expressed   Agreement Not to Harm Self Yes  Description of Agreement verbal  Danger to Others  Danger to Others None reported or observed

## 2022-11-04 LAB — BASIC METABOLIC PANEL
Anion gap: 10 (ref 5–15)
BUN: 10 mg/dL (ref 6–20)
CO2: 28 mmol/L (ref 22–32)
Calcium: 9.6 mg/dL (ref 8.9–10.3)
Chloride: 101 mmol/L (ref 98–111)
Creatinine, Ser: 0.58 mg/dL — ABNORMAL LOW (ref 0.61–1.24)
GFR, Estimated: >60 mL/min (ref >60)
Glucose, Bld: 111 mg/dL — ABNORMAL HIGH (ref 70–99)
Potassium: 3.6 mmol/L (ref 3.5–5.1)
Sodium: 139 mmol/L (ref 135–145)

## 2022-11-04 LAB — URINALYSIS, ROUTINE W REFLEX MICROSCOPIC
Bilirubin Urine: NEGATIVE
Glucose, UA: NEGATIVE mg/dL
Hgb urine dipstick: NEGATIVE
Ketones, ur: NEGATIVE mg/dL
Leukocytes,Ua: NEGATIVE
Nitrite: NEGATIVE
Protein, ur: NEGATIVE mg/dL
Specific Gravity, Urine: 1.014 (ref 1.005–1.030)
pH: 6 (ref 5.0–8.0)

## 2022-11-04 LAB — PROLACTIN: Prolactin: 3.3 ng/mL — ABNORMAL LOW (ref 3.9–22.7)

## 2022-11-04 LAB — VITAMIN B12: Vitamin B-12: 344 pg/mL (ref 180–914)

## 2022-11-04 MED ORDER — TRAZODONE HCL 50 MG PO TABS
50.0000 mg | ORAL_TABLET | Freq: Every day | ORAL | Status: DC
  Administered 2022-11-04 – 2022-11-07 (×4): 50 mg via ORAL
  Filled 2022-11-04 (×6): qty 1
  Filled 2022-11-04: qty 14

## 2022-11-04 MED ORDER — FLUOXETINE HCL 20 MG PO CAPS
20.0000 mg | ORAL_CAPSULE | Freq: Every day | ORAL | Status: DC
  Administered 2022-11-05 – 2022-11-08 (×4): 20 mg via ORAL
  Filled 2022-11-04: qty 14
  Filled 2022-11-04 (×5): qty 1

## 2022-11-04 MED ORDER — GABAPENTIN 100 MG PO CAPS
100.0000 mg | ORAL_CAPSULE | Freq: Three times a day (TID) | ORAL | Status: DC
  Administered 2022-11-04 – 2022-11-05 (×4): 100 mg via ORAL
  Filled 2022-11-04 (×10): qty 1

## 2022-11-04 MED ORDER — ARIPIPRAZOLE 5 MG PO TABS
5.0000 mg | ORAL_TABLET | Freq: Every day | ORAL | Status: DC
  Administered 2022-11-04 – 2022-11-05 (×2): 5 mg via ORAL
  Filled 2022-11-04 (×5): qty 1

## 2022-11-04 MED ORDER — PROPRANOLOL HCL 10 MG PO TABS
10.0000 mg | ORAL_TABLET | Freq: Two times a day (BID) | ORAL | Status: DC
  Administered 2022-11-04 – 2022-11-08 (×9): 10 mg via ORAL
  Filled 2022-11-04: qty 28
  Filled 2022-11-04: qty 1
  Filled 2022-11-04: qty 28
  Filled 2022-11-04 (×11): qty 1

## 2022-11-04 NOTE — Progress Notes (Signed)
[facility] MD Progress Note  11/04/2022 3:47 PM Christian Andrews  MRN:  [id number] Principal Problem: MDD (major depressive disorder), recurrent severe, without psychosis (HCC) Diagnosis: Principal Problem:   MDD (major depressive disorder), recurrent severe, without psychosis (HCC) Active Problems:   Generalized anxiety disorder   Stimulant use disorder   Alcohol use disorder  Reason For Admission: Christian Andrews is a 42 yo Caucasian male with a reported past mental health history of MDD and GAD who walked into the [place] [facility] with complaints of worsening depressive symptoms and suicidal ideations with a plan to jump off a bridge.  Patient was transferred voluntarily to this behavioral health Hospital for treatment and stabilization of his mood.  24 hr chart review: Heart rate with a sustained elevation from 90s to 100s since admission.  Only as needed medication given overnight was hydroxyzine 25 mg for anxiety.  Patient slept for 7 hours last night as per nursing documentation, he is attending unit group sessions and is participating.  No behavioral concerns noted in the past 24 hours.  Patient assessment note 11/04/2022: During this encounter, patient is noted to be restless, unable to sit still, moving from one spot to the ordered, states that typically he is not this way.  He reports feeling very anxious, and it is possible that current presentation is related to his anxiety.  He rates anxiety as 10, 10 being worst.  He rates depression as 10, 10 being worse, he reports that he always has suicidal thoughts at the back of his mind, and endorses + SI during this encounter, denies having a plan, verbally contracts for safety on the unit, denies any intent to self harm while on the unit.  He denies HI/AVH/paranoia, denies first rank symptoms and other delusional thinking. Patient reports tooth pain requesting Orajel.  Denies medication related side effects.  He reports  trouble sleeping last night, reports appetite is fair.  He reports intrusive thoughts, and racing thoughts, and also nightmares last night rendering him unable to sleep.  Patient is agreeable to the following medication adjustments: Discontinuing Seroquel, adding Abilify 5 mg daily for racing thoughts, and mood stabilization, adding gabapentin 100 mg 3 times daily for alcohol use disorder and anxiety, adding Inderal 10 mg 2 times daily for tachycardia and restlessness, adding Orajel for tooth pain, adding trazodone 50 mg nightly for sleep, increasing Prozac from 10 to 20 mg daily for management of depressive symptoms.  Rationales, benefits, possible side effects of all medications explained to patient who verbalizes understanding and is agreeable to trials.  Total Time spent with patient: 45 minutes  Past Psychiatric History:   Past Medical History: History reviewed. No pertinent past medical history.  Past Surgical History:  Procedure Laterality Date   TONSILECTOMY/ADENOIDECTOMY WITH MYRINGOTOMY Bilateral 1997   Family History: History reviewed. No pertinent family history. Family Psychiatric  History: See  H & P Social History:  Social History   Substance and Sexual Activity  Alcohol Use Yes     Social History   Substance and Sexual Activity  Drug Use Yes   Types: Cocaine    Social History   Socioeconomic History   Marital status: Single    Spouse name: Not on file   Number of children: Not on file   Years of education: Not on file   Highest education level: Not on file  Occupational History   Not on file  Tobacco Use   Smoking status: Every Day    Packs/day:  0.75    Types: Cigarettes   Smokeless tobacco: Never  Substance and Sexual Activity   Alcohol use: Yes   Drug use: Yes    Types: Cocaine   Sexual activity: Yes  Other Topics Concern   Not on file  Social History Narrative   Not on file   Social Determinants of Health   Financial Resource Strain: Not on file   Food Insecurity: Food Insecurity Present (11/03/2022)   Hunger Vital Sign    Worried About Running Out of Food in the Last Year: Sometimes true    Ran Out of Food in the Last Year: Often true  Transportation Needs: No Transportation Needs (11/03/2022)   PRAPARE - Administrator, Civil Service (Medical): No    Lack of Transportation (Non-Medical): No  Physical Activity: Not on file  Stress: Not on file  Social Connections: Not on file   Sleep: Poor  Appetite:  Fair  Current Medications: Current Facility-Administered Medications  Medication Dose Route Frequency Provider Last Rate Last Admin   acetaminophen (TYLENOL) tablet 650 mg  650 mg Oral Q6H PRN Onuoha, Chinwendu V, NP   650 mg at 11/03/22 0835   alum & mag hydroxide-simeth (MAALOX/MYLANTA) 200-200-20 MG/5ML suspension 30 mL  30 mL Oral Q4H PRN Onuoha, Chinwendu V, NP       ARIPiprazole (ABILIFY) tablet 5 mg  5 mg Oral Daily Aryiah Monterosso, NP   5 mg at 11/04/22 1305   [START ON 11/05/2022] FLUoxetine (PROZAC) capsule 20 mg  20 mg Oral Daily Junette Bernat, Tyler Aas, NP       gabapentin (NEURONTIN) capsule 100 mg  100 mg Oral TID Starleen Blue, NP   100 mg at 11/04/22 1305   hydrOXYzine (ATARAX) tablet 25 mg  25 mg Oral Q6H PRN Onuoha, Chinwendu V, NP   25 mg at 11/04/22 0827   loperamide (IMODIUM) capsule 2-4 mg  2-4 mg Oral PRN Onuoha, Chinwendu V, NP       LORazepam (ATIVAN) tablet 1 mg  1 mg Oral Q6H PRN Onuoha, Chinwendu V, NP       magnesium hydroxide (MILK OF MAGNESIA) suspension 30 mL  30 mL Oral Daily PRN Onuoha, Chinwendu V, NP       multivitamin with minerals tablet 1 tablet  1 tablet Oral Daily Onuoha, Chinwendu V, NP   1 tablet at 11/04/22 0825   nicotine (NICODERM CQ - dosed in mg/24 hours) patch 14 mg  14 mg Transdermal Daily Starleen Blue, NP   14 mg at 11/04/22 0820   ondansetron (ZOFRAN-ODT) disintegrating tablet 4 mg  4 mg Oral Q6H PRN Onuoha, Chinwendu V, NP       propranolol (INDERAL) tablet 10 mg  10 mg Oral  BID Starleen Blue, NP   10 mg at 11/04/22 1305   thiamine (Vitamin B-1) tablet 100 mg  100 mg Oral Daily Onuoha, Chinwendu V, NP   100 mg at 11/04/22 3299   traZODone (DESYREL) tablet 50 mg  50 mg Oral QHS Seena Face, NP        Lab Results:  Results for orders placed or performed during the hospital encounter of 11/03/22 (from the past 48 hour(s))  Urinalysis, Routine w reflex microscopic Urine, Clean Catch     Status: None   Collection Time: 11/03/22  7:12 AM  Result Value Ref Range   Color, Urine YELLOW YELLOW   APPearance CLEAR CLEAR   Specific Gravity, Urine 1.014 1.005 - 1.030   pH 6.0 5.0 -  8.0   Glucose, UA NEGATIVE NEGATIVE mg/dL   Hgb urine dipstick NEGATIVE NEGATIVE   Bilirubin Urine NEGATIVE NEGATIVE   Ketones, ur NEGATIVE NEGATIVE mg/dL   Protein, ur NEGATIVE NEGATIVE mg/dL   Nitrite NEGATIVE NEGATIVE   Leukocytes,Ua NEGATIVE NEGATIVE    Comment: Performed at [facility] [address], [place], [place] [address]  Vitamin B12     Status: None   Collection Time: 11/04/22  6:36 AM  Result Value Ref Range   Vitamin B-12 344 180 - 914 pg/mL    Comment: (NOTE) This assay is not validated for testing neonatal or myeloproliferative syndrome specimens for Vitamin B12 levels. Performed at [facility], [place] [address], [place], [place] [address]   Basic metabolic panel     Status: Abnormal   Collection Time: 11/04/22  6:36 AM  Result Value Ref Range   Sodium 139 135 - 145 mmol/L   Potassium 3.6 3.5 - 5.1 mmol/L   Chloride 101 98 - 111 mmol/L   CO2 28 22 - 32 mmol/L   Glucose, Bld 111 (H) 70 - 99 mg/dL    Comment: Glucose reference range applies only to samples taken after fasting for at least 8 hours.   BUN 10 6 - 20 mg/dL   Creatinine, Ser 0.58 (L) 0.61 - 1.24 mg/dL   Calcium 9.6 8.9 - 10.3 mg/dL   GFR, Estimated >60 >60 mL/min    Comment: (NOTE) Calculated using the CKD-EPI Creatinine Equation (2021)    Anion gap 10 5 -  15    Comment: Performed at [facility], [place] [address] [place] [address] [place], [place] [address]    Blood Alcohol level:  Lab Results  Component Value Date   ETH <10 11/02/2022   ETH <10 95/06/3266    Metabolic Disorder Labs: Lab Results  Component Value Date   HGBA1C 5.0 11/02/2022   MPG 96.8 11/02/2022   MPG 116.89 04/25/2022   Lab Results  Component Value Date   PROLACTIN 3.3 (L) 11/02/2022   Lab Results  Component Value Date   CHOL 237 (H) 11/02/2022   TRIG 157 (H) 11/02/2022   HDL >135 11/02/2022   CHOLHDL NOT CALCULATED 11/02/2022   VLDL 31 11/02/2022   LDLCALC NOT CALCULATED 11/02/2022   LDLCALC 109 (H) 04/25/2022    Physical Findings: AIMS: 0 CIWA:  CIWA-Ar Total: 3 COWS: n/a  Musculoskeletal: Strength & Muscle Tone: within normal limits Gait & Station: normal Patient leans: N/A  Psychiatric Specialty Exam:  Presentation  General Appearance:  Appropriate for Environment; Fairly Groomed  Eye Contact: Fair  Speech: Clear and Coherent  Speech Volume: Normal  Handedness: Right   Mood and Affect  Mood: Euthymic  Affect: Congruent   Thought Process  Thought Processes: Coherent  Descriptions of Associations:Intact  Orientation:Full (Time, Place and Person)  Thought Content:Logical  History of Schizophrenia/Schizoaffective disorder:No  Duration of Psychotic Symptoms:No data recorded Hallucinations:Hallucinations: None  Ideas of Reference:None  Suicidal Thoughts:Suicidal Thoughts: No  Homicidal Thoughts:Homicidal Thoughts: Yes, Active HI Active Intent and/or Plan: Without Intent; Without Plan  Sensorium  Memory: Immediate Good  Judgment: Fair  Insight: Fair  Materials engineer: Fair  Attention Span: Fair  Recall: Smiley Houseman of Knowledge: Fair  Language: Fair  Psychomotor Activity  Psychomotor Activity: Psychomotor Activity: Normal  Assets  Assets: Communication  Skills  Sleep  Sleep: Sleep: Poor  Physical Exam: Physical Exam Constitutional:      Appearance: Normal appearance.  HENT:  Head: Normocephalic.  Eyes:     Pupils: Pupils are equal, round, and reactive to light.  Pulmonary:     Effort: Pulmonary effort is normal.  Musculoskeletal:        General: Normal range of motion.  Neurological:     Mental Status: He is alert and oriented to person, place, and time.  Psychiatric:        Thought Content: Thought content normal.    Review of Systems  Constitutional: Negative.  Negative for diaphoresis.  HENT: Negative.    Eyes: Negative.   Respiratory: Negative.    Cardiovascular: Negative.   Gastrointestinal: Negative.   Genitourinary: Negative.   Musculoskeletal: Negative.   Skin: Negative.   Neurological:  Negative for dizziness.  Psychiatric/Behavioral:  Positive for depression and substance abuse. Negative for hallucinations, memory loss and suicidal ideas. The patient is nervous/anxious and has insomnia.    Blood pressure 114/87, pulse (!) 104, temperature 98.4 F (36.9 C), temperature source Oral, resp. rate 16, height 6\' 2"  (1.88 m), weight 72 kg, SpO2 100 %. Body mass index is 20.38 kg/m.  Treatment Plan Summary: Daily contact with patient to assess and evaluate symptoms and progress in treatment and Medication management   Observation Level/Precautions:  15 minute checks  Laboratory:  Labs reviewed   Psychotherapy:  Unit Group sessions  Medications:  See [facility]  Consultations:  To be determined   Discharge Concerns:  Safety, medication compliance, mood stability  Estimated LOS: 5-7 days  Other:  N/A    Labs independently reviewed on 11/04/2022: Lipid panel with cholesterol of 237, triglycerides of 157, educated on healthy food choices and exercise. NWG:NFAO<13. HA1C-WNL. CBC WNL. CMP with Na slightly low at 132, otherwise, WNL. Repeated BMP with Na WNL at 139. Tox screen with +cocaine and +THC. EKG with QtC-425.  Orders placed for Vitamin B1 pending, Vitamin B12 WNL, Vitamin D levels pending and baseline ua WNL.   PLAN Safety and Monitoring: Voluntary admission to inpatient psychiatric unit for safety, stabilization and treatment Daily contact with patient to assess and evaluate symptoms and progress in treatment Patient's case to be discussed in multi-disciplinary team meeting Observation Level : q15 minute checks Vital signs: q12 hours Precautions:Safety   Long Term Goal(s): Improvement in symptoms so as ready for discharge   Short Term Goals: Ability to identify changes in lifestyle to reduce recurrence of condition will improve, Ability to disclose and discuss suicidal ideas, Ability to identify and develop effective coping behaviors will improve, Compliance with prescribed medications will improve, and Ability to identify triggers associated with substance abuse/mental health issues will improve   Diagnoses  Principal Problem:   MDD (major depressive disorder), recurrent severe, without psychosis ([place]) Active Problems:   Generalized anxiety disorder   Stimulant use disorder   Alcohol use disorder -Start Abilify 5 mg daily for mood stabilization -Start Inderal 5 mg BID for tachycardia/restlessness -Start Gabapentin 100 mg TID for alcohol use d/o & anxiety                                                                                                                                                                                                                                                                                                                                                                                                                                                                                                                                                                                                                                                                                                                                                                                                                                                                                                                                                                                                                                                                                                                                                                                                                                                                                                                             -  Increase Prozac to 20 mg daily for GAD/MDD -Discontinue Seroquel 50 mg nightly for sleep/mood stabilization -Continue Hydroxyzine 25 mg every 6 hours PRN for anxiety -Continue Ativan 1mg  tabs every 6 hours PRN for CIWA>10   Other PRNS -Continue Tylenol 650 mg every 6 hours PRN for mild pain -Continue Maalox 30 mg every 4 hrs PRN for indigestion -Continue Imodium 2-4 mg as needed for diarrhea -Continue Milk of Magnesia as needed every 6 hrs for constipation -Continue Zofran disintegrating tabs every 6 hrs PRN for nausea    Discharge Planning: Social work and case management to assist with discharge planning and identification of hospital follow-up needs prior to discharge Estimated LOS: 5-7 days Discharge Concerns: Need to establish a safety plan; Medication compliance and effectiveness Discharge Goals: Return home with outpatient referrals for mental health follow-up including medication management/psychotherapy   I certify that inpatient services furnished can reasonably be expected to improve the patient's condition.     , NP 1/19/20248:51 PM   11/05/2022, NP 11/04/2022, 3:47 PM

## 2022-11-04 NOTE — Progress Notes (Signed)
   11/04/22 2100  Psych Admission Type (Psych Patients Only)  Admission Status Voluntary  Psychosocial Assessment  Patient Complaints Anxiety;Depression  Eye Contact Fair  Facial Expression Anxious  Affect Anxious  Speech Logical/coherent  Interaction Assertive  Motor Activity Fidgety  Appearance/Hygiene Unremarkable  Behavior Characteristics Cooperative  Mood Anxious;Depressed  Thought Process  Coherency Circumstantial  Content WDL  Delusions None reported or observed  Perception WDL  Hallucination None reported or observed  Judgment Poor  Confusion None  Danger to Self  Current suicidal ideation? Passive  Agreement Not to Harm Self Yes  Description of Agreement verbal  Danger to Others  Danger to Others None reported or observed   Alert/oriented.. Makes needs/concerns known to staff. Pleasant cooperative with staff. Patient reports SI but denies plan. Denies HI/A/V hallucinations. Med compliant. Patient states went to group. Will encourage continue compliance and progression towards goals. Verbally contracted for safety. Will continue to monitor.

## 2022-11-04 NOTE — Group Note (Signed)
LCSW Group Therapy Note  Group Date: 11/04/2022 Start Time: 1010 End Time: 1110   Type of Therapy and Topic:  Group Therapy: Anger Cues and Responses  Participation Level:  Active   Description of Group:   In this group, patients learned how to recognize the physical, cognitive, emotional, and behavioral responses they have to anger-provoking situations.  They identified a recent time they became angry and how they reacted.  They analyzed how their reaction was possibly beneficial and how it was possibly unhelpful.  The group discussed a variety of healthier coping skills that could help with such a situation in the future.  Focus was placed on how helpful it is to recognize the underlying emotions to our anger, because working on those can lead to a more permanent solution as well as our ability to focus on the important rather than the urgent.  Therapeutic Goals: Patients will remember their last incident of anger and how they felt emotionally and physically, what their thoughts were at the time, and how they behaved. Patients will identify how their behavior at that time worked for them, as well as how it worked against them. Patients will explore possible new behaviors to use in future anger situations. Patients will learn that anger itself is normal and cannot be eliminated, and that healthier reactions can assist with resolving conflict rather than worsening situations.  Summary of Patient Progress:  Christian Andrews was active during the group. He shared a recent occurrence wherein feeling that people were dishonest led to anger. He said that he has options for how to react that are both healthy and unhealthy, and it is sometimes hard to figure out what to do.  He appreciated the discussion about assertive, nonconfrontational methods.  He demonstrated fair insight into the subject matter, was respectful of peers, and participated throughout the entire session.  Therapeutic Modalities:   Cognitive  Behavioral Therapy    Marquette Old 11/04/2022  2:40 PM

## 2022-11-04 NOTE — Progress Notes (Signed)
   11/04/22 1200  Psych Admission Type (Psych Patients Only)  Admission Status Voluntary  Psychosocial Assessment  Patient Complaints Anxiety;Hopelessness;Depression  Eye Contact Fair  Facial Expression Anxious  Affect Anxious  Speech Logical/coherent  Interaction Assertive  Motor Activity Fidgety  Appearance/Hygiene Unremarkable  Behavior Characteristics Cooperative  Mood Anxious;Depressed  Thought Process  Coherency Circumstantial  Content WDL  Delusions None reported or observed  Perception WDL  Hallucination None reported or observed  Judgment Poor  Confusion None  Danger to Self  Current suicidal ideation? Denies  Agreement Not to Harm Self Yes  Description of Agreement verbal contract  Danger to Others  Danger to Others None reported or observed

## 2022-11-04 NOTE — Progress Notes (Signed)
[place] Group Notes:  (Nursing/MHT/Case Management/Adjunct)  Date:  11/04/2022  Time:  2000  Type of Therapy:   wrap up group  Participation Level:  Active  Participation Quality:  Appropriate, Attentive, Sharing, and Supportive  Affect:  Anxious  Cognitive:  Alert  Insight:  Improving  Engagement in Group:  Engaged  Modes of Intervention:  Clarification, Education, and Support  Summary of Progress/Problems: Positive thinking and positive change were discussed.   Shellia Cleverly 11/04/2022, 11:29 PM

## 2022-11-04 NOTE — Progress Notes (Signed)
Administered PRN Hydroxyzine per MAR per patient request. 

## 2022-11-04 NOTE — Plan of Care (Signed)
  Problem: Education: Goal: Utilization of techniques to improve thought processes will improve Outcome: Progressing   

## 2022-11-04 NOTE — Progress Notes (Signed)
11/04/22 0545  15 Minute Checks  Location Bedroom  Visual Appearance Calm  Behavior Sleeping  Sleep (Behavioral Health Patients Only)  Calculate sleep? (Click Yes once per 24 hr at 0600 safety check) Yes  Documented sleep last 24 hours 7

## 2022-11-05 MED ORDER — GABAPENTIN 100 MG PO CAPS
200.0000 mg | ORAL_CAPSULE | Freq: Three times a day (TID) | ORAL | Status: DC
  Administered 2022-11-05 – 2022-11-08 (×8): 200 mg via ORAL
  Filled 2022-11-05 (×3): qty 84
  Filled 2022-11-05 (×11): qty 2

## 2022-11-05 MED ORDER — ARIPIPRAZOLE 10 MG PO TABS
10.0000 mg | ORAL_TABLET | Freq: Every day | ORAL | Status: DC
  Administered 2022-11-06 – 2022-11-08 (×3): 10 mg via ORAL
  Filled 2022-11-05: qty 14
  Filled 2022-11-05 (×4): qty 1

## 2022-11-05 NOTE — Progress Notes (Signed)
[facility] MD Progress Note  11/05/2022 2:33 PM Christian Andrews  MRN:  [id number] Principal Problem: MDD (major depressive disorder), recurrent severe, without psychosis ([place]) Diagnosis: Principal Problem:   MDD (major depressive disorder), recurrent severe, without psychosis ([place]) Active Problems:   Generalized anxiety disorder   Stimulant use disorder   Alcohol use disorder  Reason For Admission: Christian Andrews is a 42 yo Caucasian male with a reported past mental health history of MDD and GAD who walked into the [facility] urgent care with complaints of worsening depressive symptoms and suicidal ideations with a plan to jump off a bridge.  Patient was transferred voluntarily to this behavioral health Hospital for treatment and stabilization of his mood.  24 hr chart review: V/S in the last 24 hrs WNL. CIWA earlier today morning was 12, pt was given Ativan 1 mg for this score. Reported passive SI overnight, no behavioral episodes noted in the past 24 hrs, has attended most unit group sessions.    Patient assessment note 11/05/2022: Pt reports feeling less depressed today, states that medications current medication regimen has been slightly helpful, reports feeling slightly less anxious, rates anxiety as 6, 10 being worst, rates depression as 8, 10 being worst. He continues to reports intrusive thoughts of suicide, denies having a plan, verbally contracts for safety on the unit. Denies having any intent to self harm. He denies HI/AVH, denies paranoia, denies delusional thinking.   Pt reports that his sleep quality last night was fair, and better than the night prior, reports nightmares last night related to the deaths of his brother and mother; states that he performed CPR on his mother about 7 times in the course of her illness and his brother overdosed approximately 10 times prior to ending his life. He states that his room mate and staff told him that he was screaming last night.  Pt has been educated that we will watch this for now to see if it continues to happen. He reports a poor appetite mainly because he does not like the food at this hospital.  Pt denies medication related side effects. We are increasing Gabapentin to 200 mg TID for management of anxiety, increasing Abilify to 10 mg starting 10/22, and continuing other medications as listed below. Depressive symptoms continue to be reported as being significantly high, as well as anxiety. Continuous hospitalization remains necessary to adjust medications to target current symptoms.  Total Time spent with patient: 45 minutes  Past Psychiatric History:   Past Medical History: History reviewed. No pertinent past medical history.  Past Surgical History:  Procedure Laterality Date   TONSILECTOMY/ADENOIDECTOMY WITH MYRINGOTOMY Bilateral 1997   Family History: History reviewed. No pertinent family history. Family Psychiatric  History: See  H & P Social History:  Social History   Substance and Sexual Activity  Alcohol Use Yes     Social History   Substance and Sexual Activity  Drug Use Yes   Types: Cocaine    Social History   Socioeconomic History   Marital status: Single    Spouse name: Not on file   Number of children: Not on file   Years of education: Not on file   Highest education level: Not on file  Occupational History   Not on file  Tobacco Use   Smoking status: Every Day    Packs/day: 0.75    Types: Cigarettes   Smokeless tobacco: Never  Substance and Sexual Activity   Alcohol use: Yes   Drug use: Yes  Types: Cocaine   Sexual activity: Yes  Other Topics Concern   Not on file  Social History Narrative   Not on file   Social Determinants of Health   Financial Resource Strain: Not on file  Food Insecurity: Food Insecurity Present (11/03/2022)   Hunger Vital Sign    Worried About Running Out of Food in the Last Year: Sometimes true    Ran Out of Food in the Last Year: Often true   Transportation Needs: No Transportation Needs (11/03/2022)   PRAPARE - Administrator, Civil Service (Medical): No    Lack of Transportation (Non-Medical): No  Physical Activity: Not on file  Stress: Not on file  Social Connections: Not on file   Sleep: Poor  Appetite:  Fair  Current Medications: Current Facility-Administered Medications  Medication Dose Route Frequency Provider Last Rate Last Admin   acetaminophen (TYLENOL) tablet 650 mg  650 mg Oral Q6H PRN Onuoha, Chinwendu V, NP   650 mg at 11/05/22 0756   alum & mag hydroxide-simeth (MAALOX/MYLANTA) 200-200-20 MG/5ML suspension 30 mL  30 mL Oral Q4H PRN Onuoha, Chinwendu V, NP       [START ON 11/06/2022] ARIPiprazole (ABILIFY) tablet 10 mg  10 mg Oral Daily Tyreesha Maharaj, NP       FLUoxetine (PROZAC) capsule 20 mg  20 mg Oral Daily Starleen Blue, NP   20 mg at 11/05/22 0754   gabapentin (NEURONTIN) capsule 200 mg  200 mg Oral TID Starleen Blue, NP       hydrOXYzine (ATARAX) tablet 25 mg  25 mg Oral Q6H PRN Onuoha, Chinwendu V, NP   25 mg at 11/04/22 0827   loperamide (IMODIUM) capsule 2-4 mg  2-4 mg Oral PRN Onuoha, Chinwendu V, NP       LORazepam (ATIVAN) tablet 1 mg  1 mg Oral Q6H PRN Onuoha, Chinwendu V, NP   1 mg at 11/05/22 1146   magnesium hydroxide (MILK OF MAGNESIA) suspension 30 mL  30 mL Oral Daily PRN Onuoha, Chinwendu V, NP       multivitamin with minerals tablet 1 tablet  1 tablet Oral Daily Onuoha, Chinwendu V, NP   1 tablet at 11/05/22 0753   nicotine (NICODERM CQ - dosed in mg/24 hours) patch 14 mg  14 mg Transdermal Daily Starleen Blue, NP   14 mg at 11/05/22 0755   ondansetron (ZOFRAN-ODT) disintegrating tablet 4 mg  4 mg Oral Q6H PRN Onuoha, Chinwendu V, NP       propranolol (INDERAL) tablet 10 mg  10 mg Oral BID Starleen Blue, NP   10 mg at 11/05/22 0754   thiamine (Vitamin B-1) tablet 100 mg  100 mg Oral Daily Onuoha, Chinwendu V, NP   100 mg at 11/05/22 0754   traZODone (DESYREL) tablet 50 mg   50 mg Oral QHS Starleen Blue, NP   50 mg at 11/04/22 2114    Lab Results:  Results for orders placed or performed during the hospital encounter of 11/03/22 (from the past 48 hour(s))  Vitamin B12     Status: None   Collection Time: 11/04/22  6:36 AM  Result Value Ref Range   Vitamin B-12 344 180 - 914 pg/mL    Comment: (NOTE) This assay is not validated for testing neonatal or myeloproliferative syndrome specimens for Vitamin B12 levels. Performed at [facility], [address] [place], [place] [address]   Basic metabolic panel     Status: Abnormal   Collection Time:  11/04/22  6:36 AM  Result Value Ref Range   Sodium 139 135 - 145 mmol/L   Potassium 3.6 3.5 - 5.1 mmol/L   Chloride 101 98 - 111 mmol/L   CO2 28 22 - 32 mmol/L   Glucose, Bld 111 (H) 70 - 99 mg/dL    Comment: Glucose reference range applies only to samples taken after fasting for at least 8 hours.   BUN 10 6 - 20 mg/dL   Creatinine, Ser 0.58 (L) 0.61 - 1.24 mg/dL   Calcium 9.6 8.9 - 10.3 mg/dL   GFR, Estimated >60 >60 mL/min    Comment: (NOTE) Calculated using the CKD-EPI Creatinine Equation (2021)    Anion gap 10 5 - 15    Comment: Performed at [facility], [place] [address], [place], [place] [address]    Blood Alcohol level:  Lab Results  Component Value Date   ETH <10 11/02/2022   ETH <10 27/03/2375    Metabolic Disorder Labs: Lab Results  Component Value Date   HGBA1C 5.0 11/02/2022   MPG 96.8 11/02/2022   MPG 116.89 04/25/2022   Lab Results  Component Value Date   PROLACTIN 3.3 (L) 11/02/2022   Lab Results  Component Value Date   CHOL 237 (H) 11/02/2022   TRIG 157 (H) 11/02/2022   HDL >135 11/02/2022   CHOLHDL NOT CALCULATED 11/02/2022   VLDL 31 11/02/2022   LDLCALC NOT CALCULATED 11/02/2022   LDLCALC 109 (H) 04/25/2022    Physical Findings: AIMS: 0 CIWA:  CIWA-Ar Total: 12 COWS: n/a  Musculoskeletal: Strength & Muscle Tone: within normal  limits Gait & Station: normal Patient leans: N/A  Psychiatric Specialty Exam:  Presentation  General Appearance:  Appropriate for Environment; Fairly Groomed  Eye Contact: Good  Speech: Clear and Coherent  Speech Volume: Normal  Handedness: Right   Mood and Affect  Mood: Depressed  Affect: Congruent   Thought Process  Thought Processes: Coherent  Descriptions of Associations:Intact  Orientation:Full (Time, Place and Person)  Thought Content:Logical  History of Schizophrenia/Schizoaffective disorder:No  Duration of Psychotic Symptoms:No data recorded Hallucinations:Hallucinations: None  Ideas of Reference:None  Suicidal Thoughts:Suicidal Thoughts: Yes, Active SI Active Intent and/or Plan: Without Intent; Without Plan  Homicidal Thoughts:Homicidal Thoughts: No HI Active Intent and/or Plan: Without Intent; Without Plan  Sensorium  Memory: Immediate Good  Judgment: Fair  Insight: Good  Executive Functions  Concentration: Good  Attention Span: Good  Recall: Good  Fund of Knowledge: Good  Language: Good  Psychomotor Activity  Psychomotor Activity: Psychomotor Activity: Normal  Assets  Assets: Communication Skills; Resilience  Sleep  Sleep: Sleep: Fair  Physical Exam: Physical Exam Constitutional:      Appearance: Normal appearance.  HENT:     Head: Normocephalic.  Eyes:     Pupils: Pupils are equal, round, and reactive to light.  Pulmonary:     Effort: Pulmonary effort is normal.  Musculoskeletal:        General: Normal range of motion.  Neurological:     Mental Status: He is alert and oriented to person, place, and time.  Psychiatric:        Thought Content: Thought content normal.    Review of Systems  Constitutional: Negative.  Negative for diaphoresis.  HENT: Negative.    Eyes: Negative.   Respiratory: Negative.    Cardiovascular: Negative.   Gastrointestinal: Negative.   Genitourinary: Negative.    Musculoskeletal: Negative.   Skin: Negative.   Neurological:  Negative for dizziness.  Psychiatric/Behavioral:  Positive for depression and substance abuse. Negative for hallucinations, memory loss and suicidal ideas. The patient is nervous/anxious and has insomnia.    Blood pressure (!) 126/94, pulse 81, temperature 97.9 F (36.6 C), temperature source Oral, resp. rate 16, height 6\' 2"  (1.88 m), weight 72 kg, SpO2 99 %. Body mass index is 20.38 kg/m.  Treatment Plan Summary: Daily contact with patient to assess and evaluate symptoms and progress in treatment and Medication management   Observation Level/Precautions:  15 minute checks  Laboratory:  Labs reviewed   Psychotherapy:  Unit Group sessions  Medications:  See [facility]  Consultations:  To be determined   Discharge Concerns:  Safety, medication compliance, mood stability  Estimated LOS: 5-7 days  Other:  N/A    Labs independently reviewed on 11/04/2022: Lipid panel with cholesterol of 237, triglycerides of 157, educated on healthy food choices and exercise. 11/06/2022. HA1C-WNL. CBC WNL. CMP with Na slightly low at 132, otherwise, WNL. Repeated BMP with Na WNL at 139. Tox screen with +cocaine and +THC. EKG with QtC-425. Orders placed for Vitamin B1 pending, Vitamin B12 WNL, Vitamin D levels pending and baseline ua WNL.   PLAN Safety and Monitoring: Voluntary admission to inpatient psychiatric unit for safety, stabilization and treatment Daily contact with patient to assess and evaluate symptoms and progress in treatment Patient's case to be discussed in multi-disciplinary team meeting Observation Level : q15 minute checks Vital signs: q12 hours Precautions:Safety   Long Term Goal(s): Improvement in symptoms so as ready for discharge   Short Term Goals: Ability to identify changes in lifestyle to reduce recurrence of condition will improve, Ability to disclose and discuss suicidal ideas, Ability to identify and develop effective  coping behaviors will improve, Compliance with prescribed medications will improve, and Ability to identify triggers associated with substance abuse/mental health issues will improve   Diagnoses  Principal Problem:   MDD (major depressive disorder), recurrent severe, without psychosis ([facility]) Active Problems:   Generalized anxiety disorder   Stimulant use disorder   Alcohol use disorder -Increase Abilify to 10 mg daily for mood stabilization starting 11/06/22 -Continue Inderal 10 mg BID for tachycardia/restlessness -Increase Gabapentin to 200 mg TID for alcohol use d/o & anxiety                                                                                                                                                                                                                                                                                                                                                                                                                                                                                                                                                                                                                                                                                                                                                                                                                                                                                                                                                                                                                                                                                                                                                                                                                                                                                                                            -  Continue Prozac to 20 mg daily for GAD/MDD Continue Trazodone 50 mg nightly  for sleep -Discontinue Seroquel 50 mg nightly d/t lack of effectiveness -Continue Hydroxyzine 25 mg every 6 hours PRN for anxiety -Continue Ativan 1mg  tabs every 6 hours PRN for CIWA>10   Other PRNS -Continue Tylenol 650 mg every 6 hours PRN for mild pain -Continue Maalox 30 mg every 4 hrs PRN for indigestion -Continue Imodium 2-4 mg as needed for diarrhea -Continue Milk of Magnesia as needed every 6 hrs for constipation -Continue Zofran disintegrating tabs every 6 hrs PRN for nausea    Discharge Planning: Social work and case management to assist with discharge planning and identification of hospital follow-up needs prior to discharge Estimated LOS: 5-7 days Discharge Concerns: Need to establish a safety plan; Medication compliance and effectiveness Discharge Goals: Return home with outpatient referrals for mental health follow-up including medication management/psychotherapy   I certify that inpatient services furnished can reasonably be expected to improve the patient's condition.     , NP 1/19/20248:51 PM   11/05/2022, NP 11/05/2022, 2:33 PMPatient ID: 11/07/2022, male   DOB: [birth year], 42 y.o.   MRN: [id number]

## 2022-11-05 NOTE — Plan of Care (Signed)
  Problem: Education: Goal: Emotional status will improve Outcome: Progressing Goal: Mental status will improve Outcome: Progressing Goal: Verbalization of understanding the information provided will improve Outcome: Progressing   Problem: Education: Goal: Knowledge of the prescribed therapeutic regimen will improve Outcome: Progressing   Problem: Education: Goal: Ability to make informed decisions regarding treatment will improve Outcome: Progressing   Problem: Coping: Goal: Ability to identify and develop effective coping behavior will improve Outcome: Progressing

## 2022-11-05 NOTE — BHH Group Notes (Signed)
Adult Psychoeducational Group Note  Date:  11/05/2022 Time:  10:19 AM  Group Topic/Focus:  Goals Group:   The focus of this group is to help patients establish daily goals to achieve during treatment and discuss how the patient can incorporate goal setting into their daily lives to aide in recovery.  Participation Level:  Active  Participation Quality:  Appropriate  Affect:  Appropriate  Cognitive:  Appropriate  Insight: Appropriate  Engagement in Group:  Engaged  Modes of Intervention:  Education  Additional Comments:  Today's group " Don't make Assumption's"  Camila Li 11/05/2022, 10:19 AM

## 2022-11-05 NOTE — Progress Notes (Signed)
Adult Psychoeducational Group Note  Date:  11/05/2022 Time:  8:56 PM  Group Topic/Focus:  Wrap-Up Group:   The focus of this group is to help patients review their daily goal of treatment and discuss progress on daily workbooks.  Participation Level:  Active  Participation Quality:  Appropriate  Affect:  Appropriate  Cognitive:  Appropriate  Insight: Appropriate  Engagement in Group:  Engaged  Modes of Intervention:  Discussion  Additional Comments:   Pt states that he had a long day, stating that he was super anxious throughout the day but states that he's feeling better now. Pt endorsed feelings of anxiety and depression, combined with passive thoughts of SI.   Gerhard Perches 11/05/2022, 8:56 PM

## 2022-11-05 NOTE — B[facility] Group Notes (Signed)
Pt attended goals group 

## 2022-11-05 NOTE — Progress Notes (Signed)
   11/05/22 2050  Psych Admission Type (Psych Patients Only)  Admission Status Voluntary  Psychosocial Assessment  Patient Complaints Anxiety;Depression  Eye Contact Fair  Facial Expression Anxious  Affect Anxious;Depressed  Speech Logical/coherent  Interaction Assertive  Motor Activity Other (Comment) (WDL)  Appearance/Hygiene Unremarkable  Behavior Characteristics Cooperative;Anxious  Mood Depressed;Anxious  Thought Process  Coherency WDL  Content WDL  Delusions None reported or observed  Perception WDL  Hallucination None reported or observed  Judgment Poor  Confusion None  Danger to Self  Current suicidal ideation? Passive  Description of Suicide Plan no plan  Self-Injurious Behavior Some self-injurious ideation observed or expressed.  No lethal plan expressed   Agreement Not to Harm Self Yes  Description of Agreement Verbal

## 2022-11-05 NOTE — Progress Notes (Addendum)
     D. Pt continues to present anxious, depressed, but has been visible in the milieu attending groups today. Per pt's self inventory, pt rated his depression,hopelessness and anxiety all 7/10. Pt continues to endorse passive SI- no plan, but contracts for safety. Pt required prn ativan for CIWA of 12 around mid day.   A. Labs and vitals monitored. Pt given and educated on medications. Pt supported emotionally and encouraged to express concerns and ask questions.   R. Pt remains safe with 15 minute checks. Will continue POC.    11/05/22 1200  Psych Admission Type (Psych Patients Only)  Admission Status Voluntary  Psychosocial Assessment  Patient Complaints Anxiety  Eye Contact Fair  Facial Expression Anxious  Affect Appropriate to circumstance  Speech Logical/coherent  Interaction Assertive  Motor Activity Fidgety  Appearance/Hygiene Unremarkable  Behavior Characteristics Cooperative;Appropriate to situation  Mood Depressed;Anxious  Thought Process  Coherency Circumstantial  Content WDL  Delusions None reported or observed  Perception WDL  Hallucination None reported or observed  Judgment Poor  Confusion None  Danger to Self  Current suicidal ideation? Passive  Agreement Not to Harm Self Yes  Description of Agreement verbal contract  Danger to Others  Danger to Others None reported or observed

## 2022-11-06 MED ORDER — HYDROXYZINE HCL 25 MG PO TABS
25.0000 mg | ORAL_TABLET | Freq: Four times a day (QID) | ORAL | Status: DC | PRN
  Administered 2022-11-06: 25 mg via ORAL
  Filled 2022-11-06: qty 1
  Filled 2022-11-06: qty 20

## 2022-11-06 MED ORDER — PRAZOSIN HCL 1 MG PO CAPS
1.0000 mg | ORAL_CAPSULE | Freq: Every day | ORAL | Status: DC
  Administered 2022-11-06 – 2022-11-07 (×2): 1 mg via ORAL
  Filled 2022-11-06: qty 14
  Filled 2022-11-06 (×3): qty 1

## 2022-11-06 NOTE — Progress Notes (Signed)
   11/06/22 1300  Psych Admission Type (Psych Patients Only)  Admission Status Voluntary  Psychosocial Assessment  Patient Complaints Anxiety;Depression  Eye Contact Fair  Facial Expression Anxious  Affect Anxious;Depressed  Speech Logical/coherent  Interaction Assertive  Motor Activity Other (Comment) (WDL)  Appearance/Hygiene Unremarkable  Behavior Characteristics Cooperative;Anxious  Mood Depressed;Anxious  Thought Process  Coherency WDL  Content WDL  Delusions None reported or observed  Perception WDL  Hallucination None reported or observed  Judgment Poor  Confusion None  Danger to Self  Current suicidal ideation? Passive  Description of Suicide Plan no plan  Agreement Not to Harm Self Yes  Description of Agreement verbally contracts for safety  Danger to Others  Danger to Others None reported or observed   Pt is pleasant on approach.  Pt reports passive SI with no plan or intent.  Pt says he in interested in getting treatment for substance use post d/c from [facility].  Pt took medications without incident.  Pt is safe on the unit with q 15 min checks remaining in place.

## 2022-11-06 NOTE — B[facility] Group Notes (Signed)
Patient attended the AA group. 

## 2022-11-06 NOTE — Group Note (Signed)
Recreation Therapy Group Note   Group Topic:Health and Wellness  Group Date: 11/06/2022 Start Time: 0933 End Time: 0958 Facilitators: Omauri Boeve-McCall, LRT,CTRS Location: [address] Dayroom   Goal Area(s) Addresses:  Patient will verbalize benefit of exercise during group session. Patient will verbalize an exercise that can be completed in their hospital room during admission. Patient will identify an exercise that can be completed post d/c. Patient will acknowledge benefits of exercise when used as a coping mechanism.   Group Description:  Exercise.  LRT and patients discussed the importance of exercise and how it benefits the individual.  LRT then explained to patients, they would be leading the group.  LRT explained each person would have the chance to lead the group in an exercise, stretch or dance move of their choosing.  Patients were informed to be mindful of the exercises they choose and to take in consideration their peers and any limitations they may have.  Patients were encouraged to have a good time and move!   Affect/Mood: Appropriate   Participation Level: Engaged   Participation Quality: Independent   Behavior: Appropriate   Speech/Thought Process: Focused   Insight: Good   Judgement: Good   Modes of Intervention: Music and Exercise   Patient Response to Interventions:  Engaged   Education Outcome:  Acknowledges education and In group clarification offered    Clinical Observations/Individualized Feedback: Pt was bright and engaged.  Pt interacted well with peers.  Pt completed the exercises presented until he got tired and sat down.     Plan: Continue to engage patient in RT group sessions 2-3x/week.   Betsy Rosello-McCall, LRT,CTRS 11/06/2022 12:56 PM

## 2022-11-06 NOTE — B[facility] Group Notes (Signed)
Child/Adolescent Psychoeducational Group Note  Date:  11/06/2022 Time:  10:09 AM  Group Topic/Focus:  Goals Group:   The focus of this group is to help patients establish daily goals to achieve during treatment and discuss how the patient can incorporate goal setting into their daily lives to aide in recovery.  Participation Level:  Active  Additional Comments: There wasn't a formal group today due to staffing, but I was able to get a 1:1 goals review with the pt. The patient's goal was to get a discharge plan.   Yeila Morro T Appolonia Ackert 11/06/2022, 10:09 AM

## 2022-11-06 NOTE — [facility] Group Notes (Signed)
Spiritual care group on grief and loss facilitated by chaplain Christian Andrews and Christian Andrews, counseling intern.   Group Goal: Support / Education around grief and loss  Members engage in facilitated group support and psycho-social education.  Group Description:  Following introductions and group rules, group members engaged in facilitated group dialogue and support around topic of loss, with particular support around experiences of loss in their lives. Group Identified types of loss (relationships / self / things) and identified patterns, circumstances, and changes that precipitate losses. Reflected on thoughts / feelings around loss, normalized grief responses, and recognized variety in grief experience. Group encouraged individual reflection on safe space and on the coping skills that they are already utilizing.   Group drew on Adlerian / Rogerian and narrative framework  Patient Progress: Christian Andrews actively participated and engaged in group.  He shared about the loss of his mother and brother, and how he has been processing grief.  Christian Andrews was able to healthily verbalize by the end of the group, that grief is not linear and that it will be something he learns to successfully cope with.  Christian Andrews was able to engage in an individual visit with Counseling Intern, Christian Andrews, after group, as he wanted to continue to explore his grief.

## 2022-11-06 NOTE — Progress Notes (Signed)
[facility] MD Progress Note  11/06/2022 2:34 PM Bernhardt Riemenschneider  MRN:  [id number] Principal Problem: MDD (major depressive disorder), recurrent severe, without psychosis (HCC) Diagnosis: Principal Problem:   MDD (major depressive disorder), recurrent severe, without psychosis (HCC) Active Problems:   Generalized anxiety disorder   Stimulant use disorder   Alcohol use disorder  Reason For Admission: Karlton Maya is a 42 yo Caucasian male with a reported past mental health history of MDD and GAD who walked into the [place] [facility] with complaints of worsening depressive symptoms and suicidal ideations with a plan to jump off a bridge.  Patient was transferred voluntarily to this behavioral health Hospital for treatment and stabilization of his mood.  24 hr chart review: V/S in the last 24 hrs WNL with the exception of slightly elevated heart rate of 102.  Patient has been visible in the milieu interacting with peers and attending unit group sessions.  No behavioral issues reported overnight.  Compliant with scheduled medications.  Patient assessment note 11/06/2022: Pt reports that his suicidal thoughts are chronic, and that they never completely go away, denies any suicide intent, denies any suicide plan, denies HI/AVH, denies paranoia denies delusional thinking.  Patient reports that depressive symptoms are slightly better, but continues to report depression today as being 7, 10 being worst, reports anxiety as 7, 10 being worst.  States that anxiety today is much higher as compared to yesterday because he made a decision to go to a rehab facility for 30 days to further work on his addiction to alcohol, and is unsure of what to expect from the facility.  He states that his assigned CSW told him that he will be referred to the [facility] rehab in [facility].  Patient reports that sleep last night was poor due to nightmares.  He reports that he is continuing to have  nightmares related to his brother and his mother passing away, and the traumas that he experienced related to having to perform CPR and his mother prior to her passing, and also witnessing his brother overdose on heroin multiple times prior to finally passing away.  We will start patient on prazosin 1 mg nightly to help with nightmares.  We will continue other medications as listed below.  Continuing to monitor symptoms, and awaiting CSW to locate inpatient rehab for patient.  We will continue to revisit discharge planning on a daily basis.  Total Time spent with patient: 45 minutes  Past Psychiatric History:   Past Medical History: History reviewed. No pertinent past medical history.  Past Surgical History:  Procedure Laterality Date   TONSILECTOMY/ADENOIDECTOMY WITH MYRINGOTOMY Bilateral 1997   Family History: History reviewed. No pertinent family history. Family Psychiatric  History: See  H & P Social History:  Social History   Substance and Sexual Activity  Alcohol Use Yes     Social History   Substance and Sexual Activity  Drug Use Yes   Types: Cocaine    Social History   Socioeconomic History   Marital status: Single    Spouse name: Not on file   Number of children: Not on file   Years of education: Not on file   Highest education level: Not on file  Occupational History   Not on file  Tobacco Use   Smoking status: Every Day    Packs/day: 0.75    Types: Cigarettes   Smokeless tobacco: Never  Substance and Sexual Activity   Alcohol use: Yes  Drug use: Yes    Types: Cocaine   Sexual activity: Yes  Other Topics Concern   Not on file  Social History Narrative   Not on file   Social Determinants of Health   Financial Resource Strain: Not on file  Food Insecurity: Food Insecurity Present (11/03/2022)   Hunger Vital Sign    Worried About Running Out of Food in the Last Year: Sometimes true    Ran Out of Food in the Last Year: Often true  Transportation Needs:  No Transportation Needs (11/03/2022)   PRAPARE - Hydrologist (Medical): No    Lack of Transportation (Non-Medical): No  Physical Activity: Not on file  Stress: Not on file  Social Connections: Not on file   Sleep: Poor  Appetite:  Fair  Current Medications: Current Facility-Administered Medications  Medication Dose Route Frequency Provider Last Rate Last Admin   acetaminophen (TYLENOL) tablet 650 mg  650 mg Oral Q6H PRN Onuoha, Chinwendu V, NP   650 mg at 11/05/22 2050   alum & mag hydroxide-simeth (MAALOX/MYLANTA) 200-200-20 MG/5ML suspension 30 mL  30 mL Oral Q4H PRN Onuoha, Chinwendu V, NP       ARIPiprazole (ABILIFY) tablet 10 mg  10 mg Oral Daily Caden Fatica, NP   10 mg at 11/06/22 0810   FLUoxetine (PROZAC) capsule 20 mg  20 mg Oral Daily Nicholes Rough, NP   20 mg at 11/06/22 0810   gabapentin (NEURONTIN) capsule 200 mg  200 mg Oral TID Nicholes Rough, NP   200 mg at 11/06/22 1350   hydrOXYzine (ATARAX) tablet 25 mg  25 mg Oral Q6H PRN Nicholes Rough, NP   25 mg at 11/06/22 1350   magnesium hydroxide (MILK OF MAGNESIA) suspension 30 mL  30 mL Oral Daily PRN Onuoha, Chinwendu V, NP       multivitamin with minerals tablet 1 tablet  1 tablet Oral Daily Onuoha, Chinwendu V, NP   1 tablet at 11/06/22 0810   nicotine (NICODERM CQ - dosed in mg/24 hours) patch 14 mg  14 mg Transdermal Daily Calvary Difranco, Tamela Oddi, NP   14 mg at 11/06/22 6237   propranolol (INDERAL) tablet 10 mg  10 mg Oral BID Nicholes Rough, NP   10 mg at 11/06/22 0810   thiamine (Vitamin B-1) tablet 100 mg  100 mg Oral Daily Onuoha, Chinwendu V, NP   100 mg at 11/06/22 0810   traZODone (DESYREL) tablet 50 mg  50 mg Oral QHS Abhimanyu Cruces, NP   50 mg at 11/05/22 2050    Lab Results:  No results found for this or any previous visit (from the past 67 hour(s)).   Blood Alcohol level:  Lab Results  Component Value Date   ETH <10 11/02/2022   ETH <10 62/83/1517    Metabolic Disorder  Labs: Lab Results  Component Value Date   HGBA1C 5.0 11/02/2022   MPG 96.8 11/02/2022   MPG 116.89 04/25/2022   Lab Results  Component Value Date   PROLACTIN 3.3 (L) 11/02/2022   Lab Results  Component Value Date   CHOL 237 (H) 11/02/2022   TRIG 157 (H) 11/02/2022   HDL >135 11/02/2022   CHOLHDL NOT CALCULATED 11/02/2022   VLDL 31 11/02/2022   LDLCALC NOT CALCULATED 11/02/2022   LDLCALC 109 (H) 04/25/2022    Physical Findings: AIMS: 0 CIWA:  CIWA-Ar Total: 3 COWS: n/a  Musculoskeletal: Strength & Muscle Tone: within normal limits Gait & Station: normal Patient leans: N/A  Psychiatric Specialty Exam:  Presentation  General Appearance:  Fairly Groomed  Eye Contact: Good  Speech: Clear and Coherent  Speech Volume: Normal  Handedness: Right   Mood and Affect  Mood: Anxious; Depressed  Affect: Congruent   Thought Process  Thought Processes: Coherent  Descriptions of Associations:Intact  Orientation:Full (Time, Place and Person)  Thought Content:Logical  History of Schizophrenia/Schizoaffective disorder:No  Duration of Psychotic Symptoms:No data recorded Hallucinations:Hallucinations: None  Ideas of Reference:None  Suicidal Thoughts:Suicidal Thoughts: Yes, Passive SI Active Intent and/or Plan: Without Intent; Without Plan  Homicidal Thoughts:Homicidal Thoughts: No HI Active Intent and/or Plan: Without Intent  Sensorium  Memory: Immediate Good  Judgment: Fair  Insight: Fair; Good  Executive Functions  Concentration: Fair  Attention Span: Good  Recall: Good  Fund of Knowledge: Good  Language: Good  Psychomotor Activity  Psychomotor Activity: Psychomotor Activity: Normal  Assets  Assets: Communication Skills  Sleep  Sleep: Sleep: Poor  Physical Exam: Physical Exam Constitutional:      Appearance: Normal appearance.  HENT:     Head: Normocephalic.  Eyes:     Pupils: Pupils are equal, round, and  reactive to light.  Pulmonary:     Effort: Pulmonary effort is normal.  Musculoskeletal:        General: Normal range of motion.  Neurological:     Mental Status: He is alert and oriented to person, place, and time.  Psychiatric:        Thought Content: Thought content normal.    Review of Systems  Constitutional: Negative.  Negative for diaphoresis.  HENT: Negative.    Eyes: Negative.   Respiratory: Negative.    Cardiovascular: Negative.   Gastrointestinal: Negative.   Genitourinary: Negative.   Musculoskeletal: Negative.   Skin: Negative.   Neurological:  Negative for dizziness.  Psychiatric/Behavioral:  Positive for depression and substance abuse. Negative for hallucinations, memory loss and suicidal ideas. The patient is nervous/anxious and has insomnia.    Blood pressure 106/88, pulse (!) 102, temperature 97.8 F (36.6 C), temperature source Oral, resp. rate 18, height 6\' 2"  (1.88 m), weight 72 kg, SpO2 100 %. Body mass index is 20.38 kg/m.  Treatment Plan Summary: Daily contact with patient to assess and evaluate symptoms and progress in treatment and Medication management   Observation Level/Precautions:  15 minute checks  Laboratory:  Labs reviewed   Psychotherapy:  Unit Group sessions  Medications:  See [facility]  Consultations:  To be determined   Discharge Concerns:  Safety, medication compliance, mood stability  Estimated LOS: 5-7 days  Other:  N/A    Labs independently reviewed on 11/04/2022: Lipid panel with cholesterol of 237, triglycerides of 157, educated on healthy food choices and exercise. BJY:NWGN<56. HA1C-WNL. CBC WNL. CMP with Na slightly low at 132, otherwise, WNL. Repeated BMP with Na WNL at 139. Tox screen with +cocaine and +THC. EKG with QtC-425. Orders placed for Vitamin B1 pending, Vitamin B12 WNL, Vitamin D levels pending and baseline ua WNL.   PLAN Safety and Monitoring: Voluntary admission to inpatient psychiatric unit for safety, stabilization and  treatment Daily contact with patient to assess and evaluate symptoms and progress in treatment Patient's case to be discussed in multi-disciplinary team meeting Observation Level : q15 minute checks Vital signs: q12 hours Precautions:Safety   Long Term Goal(s): Improvement in symptoms so as ready for discharge   Short Term Goals: Ability to identify changes in lifestyle to reduce recurrence of condition will improve, Ability to disclose and discuss suicidal ideas, Ability to  identify and develop effective coping behaviors will improve, Compliance with prescribed medications will improve, and Ability to identify triggers associated with substance abuse/mental health issues will improve   Diagnoses  Principal Problem:   MDD (major depressive disorder), recurrent severe, without psychosis ([place]) Active Problems:   Generalized anxiety disorder   Stimulant use disorder   Alcohol use disorder  Medications -Start Prazosin 1 mg nightly for nightmares -Continue Abilify to 10 mg daily for mood stabilization starting 11/06/22 -Continue Inderal 10 mg BID for tachycardia/restlessness -Continue Gabapentin to 200 mg TID for alcohol use d/o & anxiety                                                                                                                                                                                                                                                                                                                                                                                                                                                                                                                                                                                                                                                                                                                                                                                                                                                                                                                                                                                                                                                                                                                                                                                                                                                                                                                             -  Continue Prozac to 20 mg daily for GAD/MDD Continue Trazodone 50 mg nightly for sleep -Discontinue Seroquel 50 mg nightly d/t lack of effectiveness -Continue Hydroxyzine 25 mg every 6 hours PRN for anxiety -Continue Ativan 1mg  tabs every 6 hours PRN for CIWA>10   Other PRNS -Continue Tylenol 650 mg every 6 hours PRN for mild pain -Continue Maalox 30 mg every 4 hrs PRN for indigestion -Continue Imodium 2-4 mg as needed for diarrhea -Continue Milk of Magnesia as needed every 6 hrs for constipation -Continue Zofran disintegrating tabs every 6 hrs PRN for nausea    Discharge Planning: Social work and case management to assist with discharge planning and identification of hospital follow-up needs prior to discharge Estimated LOS: 5-7 days Discharge Concerns: Need to establish a safety plan; Medication compliance and effectiveness Discharge Goals: Return home with outpatient referrals for mental health follow-up including medication management/psychotherapy   I certify that inpatient services furnished can reasonably be expected to improve the patient's condition.     Nicholes Rough, NP 1/19/20248:51 PM   Nicholes Rough, NP 11/06/2022, 2:34 PMPatient ID: Margaretha Seeds, male   DOB: [birth year], 42 y.o.   MRN: [id number] Patient ID: Karsyn Stiltz, male   DOB: 11/09/1980, 42 y.o.   MRN: [id number]

## 2022-11-07 LAB — MISC LABCORP TEST (SEND OUT): Labcorp test code: 81950

## 2022-11-07 MED ORDER — BENZOCAINE 10 % MT GEL
Freq: Three times a day (TID) | OROMUCOSAL | Status: DC | PRN
  Filled 2022-11-07: qty 9

## 2022-11-07 MED ORDER — PROPRANOLOL HCL 10 MG PO TABS: 10.0000 mg | ORAL_TABLET | Freq: Two times a day (BID) | ORAL | 0 refills | Status: DC

## 2022-11-07 MED ORDER — FLUOXETINE HCL 20 MG PO CAPS: 20.0000 mg | ORAL_CAPSULE | Freq: Every day | ORAL | 0 refills | Status: DC

## 2022-11-07 MED ORDER — HYDROXYZINE HCL 25 MG PO TABS: 25.0000 mg | ORAL_TABLET | Freq: Four times a day (QID) | ORAL | 0 refills | Status: DC | PRN

## 2022-11-07 MED ORDER — BENZOCAINE 10 % MT GEL: Freq: Three times a day (TID) | OROMUCOSAL | 0 refills | Status: DC | PRN

## 2022-11-07 MED ORDER — PRAZOSIN HCL 1 MG PO CAPS: 1.0000 mg | ORAL_CAPSULE | Freq: Every day | ORAL | 0 refills | Status: DC

## 2022-11-07 MED ORDER — ARIPIPRAZOLE 10 MG PO TABS: 10.0000 mg | ORAL_TABLET | Freq: Every day | ORAL | 0 refills | Status: DC

## 2022-11-07 MED ORDER — GABAPENTIN 100 MG PO CAPS: 200.0000 mg | ORAL_CAPSULE | Freq: Three times a day (TID) | ORAL | 0 refills | Status: DC

## 2022-11-07 MED ORDER — TRAZODONE HCL 50 MG PO TABS: 50.0000 mg | ORAL_TABLET | Freq: Every day | ORAL | 0 refills | Status: DC

## 2022-11-07 MED ORDER — NICOTINE 14 MG/24HR TD PT24: 14.0000 mg | MEDICATED_PATCH | Freq: Every day | TRANSDERMAL | 0 refills | Status: DC

## 2022-11-07 NOTE — Progress Notes (Signed)
Adult Psychoeducational Group Note  Date:  11/07/2022 Time:  9:22 PM  Group Topic/Focus:  Wrap-Up Group:   The focus of this group is to help patients review their daily goal of treatment and discuss progress on daily workbooks.  Participation Level:  Active  Participation Quality:  Appropriate  Affect:  Appropriate  Cognitive:  Appropriate  Insight: Appropriate  Engagement in Group:  Engaged  Modes of Intervention:  Discussion  Additional Comments:   Pt states that he had a good day and is excited and anxious about his D/C tomorrow. Pt expressed gratitude towards the group and staff and states he is hopeful to stay on track with his plan. Pt endorsed feelings of anxiety and depression.   Christian Andrews 11/07/2022, 9:22 PM

## 2022-11-07 NOTE — Group Note (Signed)
Occupational Therapy Group Note   Group Topic:Goal Setting  Group Date: 11/06/2022 Start Time: 1430 End Time: 1510 Facilitators: Brantley Stage, OT   Group Description: Group encouraged engagement and participation through discussion focused on goal setting. Group members were introduced to goal-setting using the SMART Goal framework, identifying goals as Specific, Measureable, Acheivable, Relevant, and Time-Bound. Group members took time from group to create their own personal goal reflecting the SMART goal template and shared for review by peers and OT.    Therapeutic Goal(s):  Identify at least one goal that fits the SMART framework    Participation Level: Active   Participation Quality: Independent   Behavior: Appropriate   Speech/Thought Process: Directed   Affect/Mood: Appropriate   Insight: Fair   Judgement: Fair   Individualization: pt was active in their participation of group discussion/activity. New skills identified  Modes of Intervention: Education  Patient Response to Interventions:  Attentive   Plan: Continue to engage patient in OT groups 1x/week.  11/07/2022  Brantley Stage, OT Cornell Barman, OT

## 2022-11-07 NOTE — Progress Notes (Signed)
D- Patient alert and oriented.  Denies SI, HI, AVH. When asked about SI, patient smiled and said that he had none. Patient stated that he was still experiences some intrusive thoughts, but not as many as before and that he had a more positive outlook on things. Patient reports being both anxious and excited about his upcoming discharge to a 30 day rehab program. States that he is ready to "start living my life."   A- Scheduled medications administered to patient along with PRN tylenol, per MAR. Support and encouragement provided. Education on coping skills provided. Routine safety checks conducted every 15 minutes.  Patient informed to notify staff with problems or concerns.  R- No adverse drug reactions noted. Patient contracts for safety at this time. Patient verbalized understanding of education. Patient compliant with medications and treatment plan. Patient receptive, calm, and cooperative. Patient interacts well with others on the unit.  Patient remains safe at this time.

## 2022-11-07 NOTE — Plan of Care (Signed)
  Problem: Education: Goal: Emotional status will improve Outcome: Progressing Goal: Mental status will improve Outcome: Progressing   Problem: Education: Goal: Knowledge of the prescribed therapeutic regimen will improve Outcome: Progressing   Problem: Education: Goal: Ability to make informed decisions regarding treatment will improve Outcome: Progressing   Problem: Coping: Goal: Ability to identify and develop effective coping behavior will improve Outcome: Progressing

## 2022-11-07 NOTE — Progress Notes (Signed)
[facility] MD Progress Note  11/07/2022 5:25 PM Christian Andrews  MRN:  [id number] Principal Problem: MDD (major depressive disorder), recurrent severe, without psychosis (HCC) Diagnosis: Principal Problem:   MDD (major depressive disorder), recurrent severe, without psychosis (HCC) Active Problems:   Generalized anxiety disorder   Stimulant use disorder   Alcohol use disorder  Reason For Admission: Christian Andrews is a 42 yo Caucasian male with a reported past mental health history of MDD and GAD who walked into the [place] [facility] with complaints of worsening depressive symptoms and suicidal ideations with a plan to jump off a bridge.  Patient was transferred voluntarily to this behavioral health Hospital for treatment and stabilization of his mood.  24 hr chart review: V/S in the last 24 hrs WNL.  As needed medications in the last 24 hours have been Tylenol earlier today morning, and hydroxyzine today afternoon.  Patient has needed to be visible in the milieu interacting with peers and attending unit group sessions.  No behavioral issues reported overnight.  Continues to be compliant with scheduled medications.  Slept last night per nursing documentation and reports.  No behavioral issues since admission to this hospital.  Patient assessment note 11/07/2022: Pt with euthymic mood, affect is congruent. His attention to personal hygiene and grooming is good, eye contact is good, speech is clear & coherent. Thought contents are organized and logical, and pt currently denies SI/HI/AVH or paranoia. There is no evidence of delusional thoughts.  Patient denies medication related side effects, reports that he is tolerating medications well.  He denies being in any physical pain today, reports a good quality of sleep last night, reports that his appetite has improved and is good.  Reports readiness for discharge to [facility] residential treatment facility.  As per CSW, a bed became  available, and patient is scheduled to be discharged to that facility tomorrow morning at 9 AM.  We will plan to discharge patient tomorrow morning.  We are continuing all medications as listed below.  Total Time spent with patient: 45 minutes  Past Psychiatric History:   Past Medical History: History reviewed. No pertinent past medical history.  Past Surgical History:  Procedure Laterality Date   TONSILECTOMY/ADENOIDECTOMY WITH MYRINGOTOMY Bilateral 1997   Family History: History reviewed. No pertinent family history. Family Psychiatric  History: See  H & P Social History:  Social History   Substance and Sexual Activity  Alcohol Use Yes     Social History   Substance and Sexual Activity  Drug Use Yes   Types: Cocaine    Social History   Socioeconomic History   Marital status: Single    Spouse name: Not on file   Number of children: Not on file   Years of education: Not on file   Highest education level: Not on file  Occupational History   Not on file  Tobacco Use   Smoking status: Every Day    Packs/day: 0.75    Types: Cigarettes   Smokeless tobacco: Never  Substance and Sexual Activity   Alcohol use: Yes   Drug use: Yes    Types: Cocaine   Sexual activity: Yes  Other Topics Concern   Not on file  Social History Narrative   Not on file   Social Determinants of Health   Financial Resource Strain: Not on file  Food Insecurity: Food Insecurity Present (11/03/2022)   Hunger Vital Sign    Worried About Running Out of Food in the Last Year: Sometimes  true    Ran Out of Food in the Last Year: Often true  Transportation Needs: No Transportation Needs (11/03/2022)   PRAPARE - Hydrologist (Medical): No    Lack of Transportation (Non-Medical): No  Physical Activity: Not on file  Stress: Not on file  Social Connections: Not on file   Sleep: Poor  Appetite:  Fair  Current Medications: Current Facility-Administered Medications   Medication Dose Route Frequency Provider Last Rate Last Admin   acetaminophen (TYLENOL) tablet 650 mg  650 mg Oral Q6H PRN Onuoha, Chinwendu V, NP   650 mg at 11/07/22 1315   alum & mag hydroxide-simeth (MAALOX/MYLANTA) 200-200-20 MG/5ML suspension 30 mL  30 mL Oral Q4H PRN Onuoha, Chinwendu V, NP       ARIPiprazole (ABILIFY) tablet 10 mg  10 mg Oral Daily Phylicia Mcgaugh, NP   10 mg at 11/07/22 0844   benzocaine (ORAJEL) 10 % mucosal gel   Mouth/Throat TID PRN Nicholes Rough, NP   Given at 11/07/22 1401   FLUoxetine (PROZAC) capsule 20 mg  20 mg Oral Daily Damier Disano, Tamela Oddi, NP   20 mg at 11/07/22 0845   gabapentin (NEURONTIN) capsule 200 mg  200 mg Oral TID Nicholes Rough, NP   200 mg at 11/07/22 1400   hydrOXYzine (ATARAX) tablet 25 mg  25 mg Oral Q6H PRN Nicholes Rough, NP   25 mg at 11/06/22 1350   magnesium hydroxide (MILK OF MAGNESIA) suspension 30 mL  30 mL Oral Daily PRN Onuoha, Chinwendu V, NP       multivitamin with minerals tablet 1 tablet  1 tablet Oral Daily Onuoha, Chinwendu V, NP   1 tablet at 11/07/22 0845   nicotine (NICODERM CQ - dosed in mg/24 hours) patch 14 mg  14 mg Transdermal Daily Hayle Parisi, Tamela Oddi, NP   14 mg at 11/07/22 0847   prazosin (MINIPRESS) capsule 1 mg  1 mg Oral QHS Annalicia Renfrew, NP   1 mg at 11/06/22 2116   propranolol (INDERAL) tablet 10 mg  10 mg Oral BID Nicholes Rough, NP   10 mg at 11/07/22 0845   thiamine (Vitamin B-1) tablet 100 mg  100 mg Oral Daily Onuoha, Chinwendu V, NP   100 mg at 11/07/22 0844   traZODone (DESYREL) tablet 50 mg  50 mg Oral QHS Nicholes Rough, NP   50 mg at 11/06/22 2116    Lab Results:  No results found for this or any previous visit (from the past 24 hour(s)).   Blood Alcohol level:  Lab Results  Component Value Date   ETH <10 11/02/2022   ETH <10 65/78/4696    Metabolic Disorder Labs: Lab Results  Component Value Date   HGBA1C 5.0 11/02/2022   MPG 96.8 11/02/2022   MPG 116.89 04/25/2022   Lab Results  Component  Value Date   PROLACTIN 3.3 (L) 11/02/2022   Lab Results  Component Value Date   CHOL 237 (H) 11/02/2022   TRIG 157 (H) 11/02/2022   HDL >135 11/02/2022   CHOLHDL NOT CALCULATED 11/02/2022   VLDL 31 11/02/2022   LDLCALC NOT CALCULATED 11/02/2022   LDLCALC 109 (H) 04/25/2022    Physical Findings: AIMS: 0 CIWA:  CIWA-Ar Total: 3 COWS: n/a  Musculoskeletal: Strength & Muscle Tone: within normal limits Gait & Station: normal Patient leans: N/A  Psychiatric Specialty Exam:  Presentation  General Appearance:  Appropriate for Environment; Fairly Groomed  Eye Contact: Good  Speech: Clear and Coherent  Speech Volume:  Normal  Handedness: Right   Mood and Affect  Mood: Euthymic  Affect: Congruent   Thought Process  Thought Processes: Coherent  Descriptions of Associations:Intact  Orientation:Full (Time, Place and Person)  Thought Content:Logical  History of Schizophrenia/Schizoaffective disorder:No  Duration of Psychotic Symptoms:No data recorded Hallucinations:Hallucinations: None  Ideas of Reference:None  Suicidal Thoughts:Suicidal Thoughts: No SI Active Intent and/or Plan: Without Intent; Without Plan  Homicidal Thoughts:Homicidal Thoughts: No HI Active Intent and/or Plan: Without Intent  Sensorium  Memory: Immediate Good  Judgment: Good  Insight: Good  Executive Functions  Concentration: Good  Attention Span: Good  Recall: Good  Fund of Knowledge: Good  Language: Good  Psychomotor Activity  Psychomotor Activity: Psychomotor Activity: Normal  Assets  Assets: Communication Skills; Housing  Sleep  Sleep: Sleep: Good  Physical Exam: Physical Exam Constitutional:      Appearance: Normal appearance.  HENT:     Head: Normocephalic.  Eyes:     Pupils: Pupils are equal, round, and reactive to light.  Pulmonary:     Effort: Pulmonary effort is normal.  Musculoskeletal:        General: Normal range of motion.   Neurological:     Mental Status: He is alert and oriented to person, place, and time.  Psychiatric:        Thought Content: Thought content normal.    Review of Systems  Constitutional: Negative.  Negative for diaphoresis.  HENT: Negative.    Eyes: Negative.   Respiratory: Negative.    Cardiovascular: Negative.   Gastrointestinal: Negative.   Genitourinary: Negative.   Musculoskeletal: Negative.   Skin: Negative.   Neurological:  Negative for dizziness.  Psychiatric/Behavioral:  Positive for depression and substance abuse. Negative for hallucinations, memory loss and suicidal ideas. The patient is nervous/anxious and has insomnia.    Blood pressure 116/86, pulse 95, temperature 97.9 F (36.6 C), temperature source Oral, resp. rate 18, height 6\' 2"  (1.88 m), weight 72 kg, SpO2 97 %. Body mass index is 20.38 kg/m.  Treatment Plan Summary: Daily contact with patient to assess and evaluate symptoms and progress in treatment and Medication management   Observation Level/Precautions:  15 minute checks  Laboratory:  Labs reviewed   Psychotherapy:  Unit Group sessions  Medications:  See [facility]  Consultations:  To be determined   Discharge Concerns:  Safety, medication compliance, mood stability  Estimated LOS: 5-7 days  Other:  N/A    Labs independently reviewed on 11/04/2022: Lipid panel with cholesterol of 237, triglycerides of 157, educated on healthy food choices and exercise. 11/06/2022. HA1C-WNL. CBC WNL. CMP with Na slightly low at 132, otherwise, WNL. Repeated BMP with Na WNL at 139. Tox screen with +cocaine and +THC. EKG with QtC-425. Orders placed for Vitamin B1 pending, Vitamin B12 WNL, Vitamin D levels pending and baseline ua WNL.   PLAN Safety and Monitoring: Voluntary admission to inpatient psychiatric unit for safety, stabilization and treatment Daily contact with patient to assess and evaluate symptoms and progress in treatment Patient's case to be discussed in  multi-disciplinary team meeting Observation Level : q15 minute checks Vital signs: q12 hours Precautions:Safety   Long Term Goal(s): Improvement in symptoms so as ready for discharge   Short Term Goals: Ability to identify changes in lifestyle to reduce recurrence of condition will improve, Ability to disclose and discuss suicidal ideas, Ability to identify and develop effective coping behaviors will improve, Compliance with prescribed medications will improve, and Ability to identify triggers associated with substance abuse/mental health issues will  improve   Diagnoses  Principal Problem:   MDD (major depressive disorder), recurrent severe, without psychosis (HCC) Active Problems:   Generalized anxiety disorder   Stimulant use disorder   Alcohol use disorder  Medications -Continue Prazosin 1 mg nightly for nightmares -Continue Abilify to 10 mg daily for mood stabilization  -Continue Inderal 10 mg BID for tachycardia/restlessness -Continue Gabapentin to 200 mg TID for alcohol use d/o & anxiety                                                                                                                                                                                                                                                                                                                                                                                                                                                                                                                                                                                                                                                                                                                                                                                                                                                                                                                                                                                                                                                                                                                                                                                                                                                                                                                             -  Continue Prozac to 20 mg daily for GAD/MDD Continue Trazodone 50 mg nightly for sleep -Discontinue Seroquel 50 mg nightly d/t lack of effectiveness -Continue Hydroxyzine 25 mg every 6 hours PRN for anxiety -Continue Ativan 1mg  tabs every 6 hours PRN for CIWA>10   Other PRNS -Continue Tylenol 650 mg every 6 hours PRN for mild pain -Continue Maalox 30 mg every 4 hrs PRN for indigestion -Continue Imodium 2-4 mg as needed for diarrhea -Continue Milk of Magnesia as needed every 6 hrs for constipation -Continue Zofran disintegrating tabs every 6 hrs PRN for nausea    Discharge Planning: Social work and case management to assist with discharge planning and identification of hospital follow-up needs prior to discharge Estimated LOS: 5-7 days Discharge Concerns: Need to establish a safety plan; Medication compliance and effectiveness Discharge Goals: Return home with outpatient referrals for mental health follow-up including medication management/psychotherapy   I certify that inpatient services furnished can reasonably be expected to improve the patient's condition.     Nicholes Rough, NP 1/19/20248:51 PM   Nicholes Rough, NP 11/07/2022, 5:25 PMPatient ID: Christian Andrews, male   DOB: 23-Sep-1981, 42 y.o.   MRN: [id number] Patient ID: Christian Andrews, male   DOB: 1980-11-06, 42 y.o.   MRN: [id number] Patient ID: Christian Andrews, male   DOB: 07/01/1981, 42 y.o.   MRN: [id number]

## 2022-11-07 NOTE — Progress Notes (Signed)
  [facility] Adult Case Management Discharge Plan :  Will you be returning to the same living situation after discharge:  No.Patient will be going to [facility] in [place], [place] At discharge, do you have transportation home?: Yes,  CSW will arrange a Taxi to take patient to the [facility] residential  Do you have the ability to pay for your medications: No.  Release of information consent forms completed and in the chart;  Patient's signature needed at discharge.  Patient to Follow up at:  [place]. Go to.   Specialty: Behavioral Health Why: Please go to this provider for an assessment, to obtain therapy and medication management services on Monday through Friday, arrive by 7:20 am. Assessments are provided on a first come, first served basis. Contact information: [place] [phone]        Services, [facility] Follow up.   Why: Referral made Contact information: [place] [id number] [phone]                 Next level of care provider has access to [place] and Suicide Prevention discussed: No.Patient declined stating that he did not have anyone that CSW could call.      Has patient been referred to the Quitline?: N/A patient is not a smoker  Patient has been referred for addiction treatment: Yes- [facility] in [place], [place] 11/07/2022, 3:42 PM

## 2022-11-07 NOTE — Group Note (Signed)
Recreation Therapy Group Note   Group Topic:Animal Assisted Therapy   Group Date: 11/07/2022 Start Time: 1430 End Time: 1508 Facilitators: Jasani Lengel-McCall, LRT,CTRS Location: [address] Dayroom   Animal-Assisted Activity (AAA) Program Checklist/Progress Notes Patient Eligibility Criteria Checklist & Daily Group note for Rec Tx Intervention  AAA/T Program Assumption of Risk Form signed by Patient/ or Parent Legal Guardian Yes  Patient is free of allergies or severe asthma Yes  Patient reports no fear of animals Yes  Patient reports no history of cruelty to animals Yes  Patient understands his/her participation is voluntary Yes  Patient washes hands before animal contact Yes  Patient washes hands after animal contact Yes   Affect/Mood: Appropriate   Participation Level: Engaged   Participation Quality: Independent   Behavior: Appropriate    Clinical Observations/Individualized Feedback:  Patient attended session and interacted appropriately with therapy dog and peers. Patient asked appropriate questions about therapy dog and his training. Patient shared stories about their pets at home with group.    Plan: Continue to engage patient in RT group sessions 2-3x/week.   Arihanna Estabrook-McCall, LRT,CTRS 11/07/2022 4:11 PM

## 2022-11-07 NOTE — Progress Notes (Signed)
Pt says that he slept well last night and that his mood is improving.  Pt is looking forward to rehab after discharge from [facility].  Pt is interacting with peers and attending groups in the milieu.  Pt took medications without incident and vital signs have been stable.

## 2022-11-07 NOTE — Progress Notes (Signed)
[facility] LCSW Progress Note   11/07/2022    3:27 PM  Margaretha Seeds      Type of Note: [facility] Referral    CSW reached out to [facility] regarding patient referral and according to Butch Penny patient was accepted for tomorrow 11/08/2022 @ 9:30 AM. CSW went to talk with patient about acceptance and patient response was, " wow I did not think they would accept me this fast, thought I would have more time here like till thursday". CSW asked in different ways if this was what patient wanted and patient said, " yes this is the beginning and I am going to call family/friends so they know what is going on and they can bring me clothes and stuff so I can have what I need". Furthermore, CSW continued to answer all other questions patient had about program and then explained to him that if he goes and this is not what he wants it will be his choice and patient said, " no I am fine with going to this residential and finishing this 30-day  program".     Signed:   Silas Flood, MSW, [facility] 11/07/2022 3:27 PM

## 2022-11-07 NOTE — Group Note (Signed)
LCSW Group Therapy Note   Group Date: 11/07/2022 Start Time: 1100 End Time: 1200  Type of Therapy and Topic: Group Therapy: Effective Communication   Participation Level: Active    Description of Group:  In this group patients will be asked to identify their own styles of communication as well as defining and identifying passive, assertive, and aggressive styles of communication. Participants will identify strategies to communicate in a more assertive manner in an effort to appropriately meet their needs. This group will be process-oriented, with patients participating in exploration of their own experiences as well as giving and receiving support and challenge from other group members.   Therapeutic Goals: 1. Patient will identify their personal communication style. 2. Patient will identify passive, assertive, and aggressive forms of communication. 3. Patient will identify strategies for developing more effective communication to appropriately meet their needs.    Summary of Patient Progress: The Pt attended group and remained there the entire time.  The Pt accepted all worksheets and materials provided.  The Pt was appropriate with peers and with staff.  The Pt was able to demonstrate various forms of passive, aggressive, and assertive communications.     Therapeutic Modalities:  Communication Skills Solution Focused Therapy Motivational Interviewing  Darleen Crocker, [place] 11/07/2022  12:57 PM

## 2022-11-08 ENCOUNTER — Encounter (HOSPITAL_COMMUNITY): Payer: Self-pay

## 2022-11-08 DIAGNOSIS — F332 Major depressive disorder, recurrent severe without psychotic features: Principal | ICD-10-CM

## 2022-11-08 LAB — VITAMIN B1

## 2022-11-08 MED ORDER — ARIPIPRAZOLE 10 MG PO TABS: 10.0000 mg | ORAL_TABLET | Freq: Every day | ORAL | 0 refills | Status: AC

## 2022-11-08 MED ORDER — GABAPENTIN 100 MG PO CAPS: 200.0000 mg | ORAL_CAPSULE | Freq: Three times a day (TID) | ORAL | 0 refills | Status: DC

## 2022-11-08 MED ORDER — BENZOCAINE 10 % MT GEL: Freq: Three times a day (TID) | OROMUCOSAL | 0 refills | Status: AC | PRN

## 2022-11-08 MED ORDER — HYDROXYZINE HCL 25 MG PO TABS: 25.0000 mg | ORAL_TABLET | Freq: Four times a day (QID) | ORAL | 0 refills | Status: DC | PRN

## 2022-11-08 MED ORDER — PROPRANOLOL HCL 10 MG PO TABS: 10.0000 mg | ORAL_TABLET | Freq: Two times a day (BID) | ORAL | 0 refills | Status: DC

## 2022-11-08 MED ORDER — TRAZODONE HCL 50 MG PO TABS: 50.0000 mg | ORAL_TABLET | Freq: Every day | ORAL | 0 refills | Status: DC

## 2022-11-08 MED ORDER — FLUOXETINE HCL 20 MG PO CAPS: 20.0000 mg | ORAL_CAPSULE | Freq: Every day | ORAL | 0 refills | Status: DC

## 2022-11-08 MED ORDER — NICOTINE 14 MG/24HR TD PT24: 14.0000 mg | MEDICATED_PATCH | Freq: Every day | TRANSDERMAL | 0 refills | Status: AC

## 2022-11-08 MED ORDER — PRAZOSIN HCL 1 MG PO CAPS: 1.0000 mg | ORAL_CAPSULE | Freq: Every day | ORAL | 0 refills | Status: DC

## 2022-11-08 NOTE — Discharge Summary (Signed)
Physician Discharge Summary Note  Patient:  Christian Andrews is an 42 y.o., male MRN:  [id number] DOB:  [birth year] Patient phone:  [phone] (home)  Patient address:   [address] [place]  [id number],  Total Time spent with patient: 1.5 hours  Date of Admission:  11/03/2022 Date of Discharge: 11/08/2022  Reason for Admission:   [name] is a 42 yo Caucasian male with a reported past mental health history of MDD and GAD who walked into the [facility] with complaints of worsening depressive symptoms and suicidal ideations with a plan to jump off a bridge.  Patient was transferred voluntarily to this behavioral health Hospital for treatment and stabilization of his mood.   Principal Problem: MDD (major depressive disorder), recurrent severe, without psychosis ([name]) Discharge Diagnoses: Principal Problem:   MDD (major depressive disorder), recurrent severe, without psychosis ([name]) Active Problems:   Generalized anxiety disorder   Stimulant use disorder   Alcohol use disorder  Past Psychiatric History: alcohol use d/o, MDD  Past Medical History: History reviewed. No pertinent past medical history.  Past Surgical History:  Procedure Laterality Date   TONSILECTOMY/ADENOIDECTOMY WITH MYRINGOTOMY Bilateral 1997   Family History: History reviewed. No pertinent family history. Family Psychiatric  History: See H & P Social History:  Social History   Substance and Sexual Activity  Alcohol Use Yes     Social History   Substance and Sexual Activity  Drug Use Yes   Types: Cocaine    Social History   Socioeconomic History   Marital status: Single    Spouse name: Not on file   Number of children: Not on file   Years of education: Not on file   Highest education level: Not on file  Occupational History   Not on file  Tobacco Use   Smoking status: Every Day    Packs/day: 0.75    Types: Cigarettes   Smokeless tobacco: Never   Substance and Sexual Activity   Alcohol use: Yes   Drug use: Yes    Types: Cocaine   Sexual activity: Yes  Other Topics Concern   Not on file  Social History Narrative   Not on file   Social Determinants of Health   Financial Resource Strain: Not on file  Food Insecurity: Food Insecurity Present (11/03/2022)   Hunger Vital Sign    Worried About Running Out of Food in the Last Year: Sometimes true    Ran Out of Food in the Last Year: Often true  Transportation Needs: No Transportation Needs (11/03/2022)   PRAPARE - Hydrologist (Medical): No    Lack of Transportation (Non-Medical): No  Physical Activity: Not on file  Stress: Not on file  Social Connections: Not on file                                             HOSPITAL COURSE: During the patient's hospitalization, patient had extensive initial psychiatric evaluation, and follow-up psychiatric evaluations every day. Psychiatric diagnoses provided upon initial assessment are as listed above. Patient's psychiatric medications were adjusted on admission as follows: -Continue Prozac 10 mg daily for GAD/MDD -Continue Seroquel 50 mg nightly for sleep/mood stabilization -Continue Hydroxyzine 25 mg every 6 hours PRN for anxiety -Continue Ativan 1mg  tabs every 6 hours PRN for CIWA>10   During the hospitalization, other adjustments were  made to the patient's psychiatric medication regimen: Medications at discharge are as follows: -Continue Prazosin 1 mg nightly for nightmares -Continue Prozac to 20 mg daily for GAD/MDD Continue Trazodone 50 mg nightly for sleep -Continue Hydroxyzine 25 mg every 6 hours PRN for anxiety -Continue Abilify to 10 mg daily for mood stabilization starting 11/06/22 -Continue Inderal 10 mg BID for tachycardia/restlessness -Continue Gabapentin to 200 mg TID for alcohol use d/o & anxiety  Patient's care was discussed during the interdisciplinary team meeting every day during the  hospitalization. The patient denies having side effects to prescribed psychiatric medication. Gradually, patient started adjusting to milieu. The patient was evaluated each day by a clinical provider to ascertain response to treatment. Improvement was noted by the patient's report of decreasing symptoms, improved sleep and appetite, affect, medication tolerance, behavior, and participation in unit programming.  Patient was asked each day to complete a self inventory noting mood, mental status, pain, new symptoms, anxiety and concerns.     Symptoms were reported as significantly decreased or resolved completely by discharge. On day of discharge, the patient reports that their mood is stable. The patient denied having suicidal thoughts for more than 48 hours prior to discharge.  Patient denies having homicidal thoughts.  Patient denies having auditory hallucinations.  Patient denies any visual hallucinations or other symptoms of psychosis. The patient was motivated to continue taking medication with a goal of continued improvement in mental health.    The patient reports their target psychiatric symptoms of depression, anxiety and insomnia responded well to the psychiatric medications, and the patient reports overall benefit from this psychiatric hospitalization. Supportive psychotherapy was provided to the patient. The patient also participated in regular group therapy while hospitalized. Coping skills, problem solving as well as relaxation therapies were also part of the unit programming.   Labs were reviewed with the patient, and abnormal results were discussed with the patient. Lipid panel with cholesterol of 237, triglycerides of 157, educated on healthy food choices and exercise. GUY:QIHK<74. HA1C-WNL. CBC WNL. CMP with Na slightly low at 132, otherwise, WNL. Repeated BMP with Na WNL at 139. Tox screen with +cocaine and +THC. EKG with QtC-425, Vitamin B12 WNL, baseline ua WNL.    The patient is able to  verbalize their individual safety plan to this provider.   # It is recommended to the patient to continue psychiatric medications as prescribed, after discharge from the hospital.     # It is recommended to the patient to follow up with your outpatient psychiatric provider and PCP.   # It was discussed with the patient, the impact of alcohol, drugs, tobacco have been there overall psychiatric and medical wellbeing, and total abstinence from substance use was recommended the patient.ed.   # Prescriptions provided or sent directly to preferred pharmacy at discharge. Patient agreeable to plan. Given opportunity to ask questions. Appears to feel comfortable with discharge.    # In the event of worsening symptoms, the patient is instructed to call the crisis hotline, 911 and or go to the nearest ED for appropriate evaluation and treatment of symptoms. To follow-up with primary care provider for other medical issues, concerns and or health care needs   # Patient was discharged  to [facility] residential treatment program with a plan to follow up as noted below.  Physical Findings: AIMS: 0 CIWA:  CIWA-Ar Total: 0 COWS:     Musculoskeletal: Strength & Muscle Tone: within normal limits Gait & Station: normal Patient leans: N/A   Psychiatric Specialty Exam:  Presentation  General Appearance:  Appropriate for Environment; Fairly Groomed  Eye Contact: Good  Speech: Clear and Coherent  Speech Volume: Normal  Handedness: Right   Mood and Affect  Mood: Euthymic  Affect: Congruent   Thought Process  Thought Processes: Coherent  Descriptions of Associations:Intact  Orientation:Full (Time, Place and Person)  Thought Content:Logical  History of Schizophrenia/Schizoaffective disorder:No  Duration of Psychotic Symptoms:No data recorded Hallucinations:Hallucinations: None  Ideas of Reference:None  Suicidal Thoughts:Suicidal Thoughts: No  Homicidal Thoughts:Homicidal Thoughts: No  Sensorium  Memory: Immediate Good  Judgment: Good  Insight: Good  Executive Functions  Concentration: Good  Attention Span: Good  Recall: Good  Fund of Knowledge: Good  Language: Good  Psychomotor Activity  Psychomotor Activity: Psychomotor Activity: Normal   Assets  Assets: Communication Skills; Housing   Sleep  Sleep: Sleep: Good    Physical Exam: Physical Exam Constitutional:      Appearance: Normal appearance.  HENT:     Head: Normocephalic.     Nose: Nose normal. No congestion or rhinorrhea.  Eyes:     Pupils: Pupils are equal, round, and reactive to light.  Musculoskeletal:        General: Normal range of motion.     Cervical back: Normal range of motion.  Neurological:     Mental Status: He is alert and oriented to person, place, and time.  Psychiatric:        Mood and Affect: Mood normal.    Review of Systems  Constitutional:   Negative for fever.  HENT: Negative.    Eyes: Negative.   Respiratory: Negative.    Cardiovascular: Negative.   Gastrointestinal: Negative.   Genitourinary: Negative.   Musculoskeletal: Negative.   Skin: Negative.   Neurological: Negative.   Psychiatric/Behavioral:  Positive for depression (Denies SI/HI/AVH, denies paranoia and verbally contracts for safety outside of [facility]) and substance abuse (Educated on the negative impact of substance use and on the need to cease using). Negative for hallucinations, memory loss and suicidal ideas. The patient is nervous/anxious (resolving on current medications) and has insomnia (resolving with current medications).    Blood pressure 118/88, pulse (!) 110, temperature 97.7 F (36.5 C), temperature source Oral, resp. rate 12, height 6\' 2"  (1.88 m), weight 72 kg, SpO2 99 %. Body mass index is 20.38 kg/m.   Social History   Tobacco Use  Smoking Status Every Day   Packs/day: 0.75   Types: Cigarettes  Smokeless Tobacco Never   Tobacco Cessation:  A prescription for an FDA-approved tobacco cessation medication provided at discharge  Blood Alcohol level:  Lab Results  Component Value Date   [facility] <10 11/02/2022   ETH <10 04/25/2022   Metabolic Disorder Labs:  Lab Results  Component Value Date   HGBA1C 5.0 11/02/2022   MPG 96.8 11/02/2022   MPG 116.89 04/25/2022   Lab Results  Component Value Date   PROLACTIN 3.3 (L) 11/02/2022   Lab Results  Component Value Date   CHOL 237 (H) 11/02/2022   TRIG 157 (H) 11/02/2022   HDL >135 11/02/2022   CHOLHDL NOT CALCULATED 11/02/2022   VLDL 31 11/02/2022   LDLCALC NOT CALCULATED 11/02/2022   LDLCALC 109 (H) 04/25/2022    See Psychiatric Specialty Exam and Suicide Risk Assessment completed by Attending Physician prior to  discharge.  Discharge destination:  Home  Is patient on multiple antipsychotic therapies at discharge:  No   Has Patient had three or more failed trials of antipsychotic  monotherapy by history:  No  Recommended Plan for Multiple Antipsychotic Therapies: NA   Allergies as of 11/08/2022   No Known Allergies      Medication List     STOP taking these medications    CORTIZONE-10 EX   ibuprofen 200 MG tablet Commonly known as: ADVIL   QUEtiapine 50 MG tablet Commonly known as: SEROquel       TAKE these medications      Indication  ARIPiprazole 10 MG tablet Commonly known as: ABILIFY Take 1 tablet (10 mg total) by mouth daily.  Indication: Major Depressive Disorder   benzocaine 10 % mucosal gel Commonly known as: ORAJEL Use as directed in the mouth or throat 3 (three) times daily as needed for mouth pain.  Indication: tooth pain   FLUoxetine 20 MG capsule Commonly known as: PROZAC Take 1 capsule (20 mg total) by mouth daily. What changed:  medication strength how much to take  Indication: Major Depressive Disorder   gabapentin 100 MG capsule Commonly known as: NEURONTIN Take 2 capsules (200 mg total) by mouth 3 (three) times daily.  Indication: Abuse or Misuse of Alcohol   hydrOXYzine 25 MG tablet Commonly known as: ATARAX Take 1 tablet (25 mg total) by mouth every 6 (six) hours as needed (anxiety).  Indication: Feeling Anxious   nicotine 14 mg/24hr patch Commonly known as: NICODERM CQ - dosed in mg/24 hours Place 1 patch (14 mg total) onto the skin daily.  Indication: Nicotine Addiction   prazosin 1 MG capsule Commonly known as: MINIPRESS Take 1 capsule (1 mg total) by mouth at bedtime.  Indication: Frightening Dreams   propranolol 10 MG tablet Commonly known as: INDERAL Take 1 tablet (10 mg total) by mouth 2 (two) times daily.  Indication: Feeling Anxious   traZODone 50 MG tablet Commonly known as: DESYREL Take 1 tablet (50 mg total) by mouth at bedtime.  Indication: [place]. Go to.   Specialty: Behavioral Health Why:  Please go to this provider for an assessment, to obtain therapy and medication management services on Monday through Friday, arrive by 7:20 am. Assessments are provided on a first come, first served basis. Contact information: [place] [phone]        Services, [facility] Follow up.   Why: Referral made Contact information: [place] [address] [phone]                Signed: Nicholes Rough, NP 11/08/2022, 3:16 PM

## 2022-11-08 NOTE — Progress Notes (Signed)
Pt was educated on discharge. Pt was given discharge papers. Copy of safety plan placed in chart. Pt was satisfied all belongings were returned. Pt was discharged to lobby.  

## 2022-11-08 NOTE — BH IP Treatment Plan (Signed)
Interdisciplinary Treatment and Diagnostic Plan Update  11/08/2022 Time of Session: 10:10am  Christian Andrews MRN: [id number]  Principal Diagnosis: MDD (major depressive disorder), recurrent severe, without psychosis ([place])  Secondary Diagnoses: Principal Problem:   MDD (major depressive disorder), recurrent severe, without psychosis ([place]) Active Problems:   Generalized anxiety disorder   Stimulant use disorder   Alcohol use disorder   Current Medications:  Current Facility-Administered Medications  Medication Dose Route Frequency Provider Last Rate Last Admin   acetaminophen (TYLENOL) tablet 650 mg  650 mg Oral Q6H PRN Onuoha, Chinwendu V, NP   650 mg at 11/07/22 2128   alum & mag hydroxide-simeth (MAALOX/MYLANTA) 200-200-20 MG/5ML suspension 30 mL  30 mL Oral Q4H PRN Onuoha, Chinwendu V, NP       ARIPiprazole (ABILIFY) tablet 10 mg  10 mg Oral Daily Nkwenti, Doris, NP   10 mg at 11/08/22 0734   benzocaine (ORAJEL) 10 % mucosal gel   Mouth/Throat TID PRN Nicholes Rough, NP   Given at 11/07/22 1401   FLUoxetine (PROZAC) capsule 20 mg  20 mg Oral Daily Nkwenti, Tamela Oddi, NP   20 mg at 11/08/22 0093   gabapentin (NEURONTIN) capsule 200 mg  200 mg Oral TID Nicholes Rough, NP   200 mg at 11/08/22 8182   hydrOXYzine (ATARAX) tablet 25 mg  25 mg Oral Q6H PRN Nicholes Rough, NP   25 mg at 11/06/22 1350   magnesium hydroxide (MILK OF MAGNESIA) suspension 30 mL  30 mL Oral Daily PRN Onuoha, Chinwendu V, NP       multivitamin with minerals tablet 1 tablet  1 tablet Oral Daily Onuoha, Chinwendu V, NP   1 tablet at 11/08/22 0732   nicotine (NICODERM CQ - dosed in mg/24 hours) patch 14 mg  14 mg Transdermal Daily Nkwenti, Tamela Oddi, NP   14 mg at 11/08/22 0735   prazosin (MINIPRESS) capsule 1 mg  1 mg Oral QHS Nkwenti, Doris, NP   1 mg at 11/07/22 2128   propranolol (INDERAL) tablet 10 mg  10 mg Oral BID Nicholes Rough, NP   10 mg at 11/08/22 0732   thiamine (Vitamin B-1) tablet 100 mg  100 mg Oral Daily  Onuoha, Chinwendu V, NP   100 mg at 11/08/22 9937   traZODone (DESYREL) tablet 50 mg  50 mg Oral QHS Nkwenti, Doris, NP   50 mg at 11/07/22 2128   PTA Medications: Medications Prior to Admission  Medication Sig Dispense Refill Last Dose   Hydrocortisone (JIRCVELFY-10 EX) Apply 1 Application topically daily as needed (For rash).   3 weeks ago   ibuprofen (ADVIL) 200 MG tablet Take 800 mg by mouth every 6 (six) hours as needed (For tooth pain).   11/02/2022   FLUoxetine (PROZAC) 10 MG capsule Take 1 capsule (10 mg total) by mouth daily. (Patient not taking: Reported on 11/03/2022) 30 capsule 0 Not Taking   QUEtiapine (SEROQUEL) 50 MG tablet Take 1 tablet (50 mg total) by mouth at bedtime. (Patient not taking: Reported on 11/03/2022) 30 tablet 1 Not Taking    Patient Stressors: Loss of mother and brother through death.   Marital or family conflict   Occupational concerns   Substance abuse    Patient Strengths: Armed forces logistics/support/administrative officer  Physical Health  Work skills   Treatment Modalities: Medication Management, Group therapy, Case management,  1 to 1 session with clinician, Psychoeducation, Recreational therapy.   Physician Treatment Plan for Primary Diagnosis: MDD (major depressive disorder), recurrent severe, without psychosis ([place]) Long Term Goal(s):  Improvement in symptoms so as ready for discharge   Short Term Goals: Ability to identify changes in lifestyle to reduce recurrence of condition will improve Ability to disclose and discuss suicidal ideas Ability to identify and develop effective coping behaviors will improve Compliance with prescribed medications will improve Ability to identify triggers associated with substance abuse/mental health issues will improve  Medication Management: Evaluate patient's response, side effects, and tolerance of medication regimen.  Therapeutic Interventions: 1 to 1 sessions, Unit Group sessions and Medication administration.  Evaluation of Outcomes:  Adequate for Discharge  Physician Treatment Plan for Secondary Diagnosis: Principal Problem:   MDD (major depressive disorder), recurrent severe, without psychosis ([place]) Active Problems:   Generalized anxiety disorder   Stimulant use disorder   Alcohol use disorder  Long Term Goal(s): Improvement in symptoms so as ready for discharge   Short Term Goals: Ability to identify changes in lifestyle to reduce recurrence of condition will improve Ability to disclose and discuss suicidal ideas Ability to identify and develop effective coping behaviors will improve Compliance with prescribed medications will improve Ability to identify triggers associated with substance abuse/mental health issues will improve     Medication Management: Evaluate patient's response, side effects, and tolerance of medication regimen.  Therapeutic Interventions: 1 to 1 sessions, Unit Group sessions and Medication administration.  Evaluation of Outcomes: Adequate for Discharge   RN Treatment Plan for Primary Diagnosis: MDD (major depressive disorder), recurrent severe, without psychosis ([place]) Long Term Goal(s): Knowledge of disease and therapeutic regimen to maintain health will improve  Short Term Goals: Ability to remain free from injury will improve, Ability to participate in decision making will improve, Ability to verbalize feelings will improve, Ability to disclose and discuss suicidal ideas, and Ability to identify and develop effective coping behaviors will improve  Medication Management: RN will administer medications as ordered by provider, will assess and evaluate patient's response and provide education to patient for prescribed medication. RN will report any adverse and/or side effects to prescribing provider.  Therapeutic Interventions: 1 on 1 counseling sessions, Psychoeducation, Medication administration, Evaluate responses to treatment, Monitor vital signs and CBGs as ordered, Perform/monitor CIWA,  COWS, AIMS and Fall Risk screenings as ordered, Perform wound care treatments as ordered.  Evaluation of Outcomes: Adequate for Discharge   LCSW Treatment Plan for Primary Diagnosis: MDD (major depressive disorder), recurrent severe, without psychosis ([place]) Long Term Goal(s): Safe transition to appropriate next level of care at discharge, Engage patient in therapeutic group addressing interpersonal concerns.  Short Term Goals: Engage patient in aftercare planning with referrals and resources, Increase social support, Increase emotional regulation, Facilitate acceptance of mental health diagnosis and concerns, Identify triggers associated with mental health/substance abuse issues, and Increase skills for wellness and recovery  Therapeutic Interventions: Assess for all discharge needs, 1 to 1 time with Social worker, Explore available resources and support systems, Assess for adequacy in community support network, Educate family and significant other(s) on suicide prevention, Complete Psychosocial Assessment, Interpersonal group therapy.  Evaluation of Outcomes: Adequate for Discharge   Progress in Treatment: Attending groups: Yes. Participating in groups: Yes. Taking medication as prescribed: Yes. Toleration medication: Yes. Family/Significant other contact made: No, will contact:  patient declined consents  Patient understands diagnosis: Yes. Discussing patient identified problems/goals with staff: Yes. Medical problems stabilized or resolved: Yes. Denies suicidal/homicidal ideation: Yes. Issues/concerns per patient self-inventory: No.   New problem(s) identified: No, Describe:  None   New Short Term/Long Term Goal(s): medication stabilization, elimination of SI thoughts, development  of comprehensive mental wellness plan.   Patient Goals:  Pt did not attend   Discharge Plan or Barriers: Patient recently admitted. CSW will continue to follow and assess for appropriate referrals and  possible discharge planning.   Reason for Continuation of Hospitalization: Medication stabilization  Estimated Length of Stay: Adequate for Discharge   Last 3 [place] Suicide Severity Risk Score: Flowsheet Row [facility] (Dis[facility]  C-SSRS RISK CATEGORY No Risk High Risk Moderate Risk       Last PHQ 2/9 Scores:    05/09/2022   10:57 AM  Depression screen PHQ 2/9  Decreased Interest 3  Down, Depressed, Hopeless 3  PHQ - 2 Score 6  Altered sleeping 2  Tired, decreased energy 3  Change in appetite 2  Feeling bad or failure about yourself  3  Trouble concentrating 2  Moving slowly or fidgety/restless 1  Suicidal thoughts 2  PHQ-9 Score 21    Scribe for Treatment Team: Aram Beecham, Theresia Majors 11/08/2022 11:10 AM

## 2022-11-08 NOTE — Progress Notes (Signed)
   11/08/22 0557  15 Minute Checks  Location Bedroom  Visual Appearance Calm  Behavior Sleeping  Sleep (Behavioral Health Patients Only)  Calculate sleep? (Click Yes once per 24 hr at 0600 safety check) Yes  Documented sleep last 24 hours 8.25
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
## 2022-12-12 ENCOUNTER — Ambulatory Visit (INDEPENDENT_AMBULATORY_CARE_PROVIDER_SITE_OTHER): Payer: No Payment, Other | Admitting: Physician Assistant

## 2022-12-12 ENCOUNTER — Encounter (HOSPITAL_COMMUNITY): Payer: Self-pay | Admitting: Physician Assistant

## 2022-12-12 VITALS — BP 130/85 | HR 70 | Temp 97.9°F | Ht 73.0 in | Wt 187.4 lb

## 2022-12-12 DIAGNOSIS — F4310 Post-traumatic stress disorder, unspecified: Secondary | ICD-10-CM | POA: Diagnosis not present

## 2022-12-12 DIAGNOSIS — F411 Generalized anxiety disorder: Secondary | ICD-10-CM

## 2022-12-12 DIAGNOSIS — F1021 Alcohol dependence, in remission: Secondary | ICD-10-CM | POA: Diagnosis not present

## 2022-12-12 DIAGNOSIS — F332 Major depressive disorder, recurrent severe without psychotic features: Secondary | ICD-10-CM | POA: Diagnosis not present

## 2022-12-12 MED ORDER — FLUOXETINE HCL 40 MG PO CAPS: 40.0000 mg | ORAL_CAPSULE | Freq: Every day | ORAL | 1 refills | Status: DC

## 2022-12-12 MED ORDER — PROPRANOLOL HCL 10 MG PO TABS: 10.0000 mg | ORAL_TABLET | Freq: Two times a day (BID) | ORAL | 1 refills | Status: DC

## 2022-12-12 MED ORDER — TRAZODONE HCL 50 MG PO TABS: 50.0000 mg | ORAL_TABLET | Freq: Every day | ORAL | 1 refills | Status: AC

## 2022-12-12 MED ORDER — GABAPENTIN 100 MG PO CAPS: 200.0000 mg | ORAL_CAPSULE | Freq: Three times a day (TID) | ORAL | 1 refills | Status: DC

## 2022-12-12 MED ORDER — HYDROXYZINE HCL 25 MG PO TABS: 25.0000 mg | ORAL_TABLET | Freq: Four times a day (QID) | ORAL | 1 refills | Status: DC | PRN

## 2022-12-12 MED ORDER — PRAZOSIN HCL 1 MG PO CAPS: 1.0000 mg | ORAL_CAPSULE | Freq: Every day | ORAL | 1 refills | Status: DC

## 2022-12-12 NOTE — Progress Notes (Unsigned)
Psychiatric Initial Adult Assessment   Patient Identification: Christian Andrews MRN:  [id number] Date of Evaluation:  12/12/2022 Referral Source: Follow up appointment following discharge from [facility] Chief Complaint:   Chief Complaint  Patient presents with   Establish Care   Medication Management   Visit Diagnosis:    ICD-10-CM   1. PTSD (post-traumatic stress disorder)  F43.10 FLUoxetine (PROZAC) 40 MG capsule    prazosin (MINIPRESS) 1 MG capsule    2. Generalized anxiety disorder  F41.1 hydrOXYzine (ATARAX) 25 MG tablet    gabapentin (NEURONTIN) 100 MG capsule    FLUoxetine (PROZAC) 40 MG capsule    propranolol (INDERAL) 10 MG tablet    3. Severe episode of recurrent major depressive disorder, without psychotic features ([place])  F33.2 traZODone (DESYREL) 50 MG tablet    FLUoxetine (PROZAC) 40 MG capsule    4. Alcohol use disorder, severe, in early remission ([place])  F10.21 gabapentin (NEURONTIN) 100 MG capsule      History of Present Illness:  ***  Margaretha Seeds  Associated Signs/Symptoms: Depression Symptoms:  depressed mood, anhedonia, psychomotor agitation, psychomotor retardation, fatigue, feelings of worthlessness/guilt, difficulty concentrating, hopelessness, impaired memory, recurrent thoughts of death, suicidal thoughts without plan, anxiety, panic attacks, loss of energy/fatigue, disturbed sleep, weight gain, increased appetite, (Hypo) Manic Symptoms:  Distractibility, Flight of Ideas, Community education officer, Impulsivity, Irritable Mood, Labiality of Mood, Anxiety Symptoms:  Agoraphobia, Excessive Worry, Social Anxiety, Specific Phobias, Psychotic Symptoms:   Patient denies PTSD Symptoms: Had a traumatic exposure:  Patient reports that the death of his mother and brother was traumatic for him. He reports that they died 3 days from each other. Patient states that his mother passed and then his brother committed  suicide. Had a traumatic exposure in the last month:  N/A Re-experiencing:  Flashbacks Intrusive Thoughts Nightmares Hypervigilance:  Yes Hyperarousal:  Difficulty Concentrating Emotional Numbness/Detachment Increased Startle Response Irritability/Anger Sleep Avoidance:  Decreased Interest/Participation Foreshortened Future  Past Psychiatric History:  Patient has a past history of depression and anxiety.  During his hospitalization, patient states that the providers were deciding on whether or not he was bipolar but no official diagnosis was given at the time.  Patient states that he was previously hospitalized due to mental health in July of last year.  During that time, patient states that he was hospitalized due to major depressive disorder and suicidal thoughts with no plan (patient states that he was experiencing racing thoughts at the time).  Patient endorses a past history of suicide 20 years ago.  Patient states that he his attempted suicide by overdosing and by cutting his wrist  Previous Psychotropic Medications: Yes   Substance Abuse History in the last 12 months:  Yes.  Patient reports that he used cocaine roughly 42 days ago  Consequences of Substance Abuse: Medical Consequences:  Patient denies Legal Consequences:  Patient denies Family Consequences:  Patient denies Blackouts:  Patient endorses a past history of blackouts DT's: Patient denies Withdrawal Symptoms:   Patient endorses experiencing cravings  Past Medical History: History reviewed. No pertinent past medical history.  Past Surgical History:  Procedure Laterality Date   TONSILECTOMY/ADENOIDECTOMY WITH MYRINGOTOMY Bilateral 1997    Family Psychiatric History:  Mother (deceased) - bipolar disorder Brother (deceased) - bipolar disorder  Family history of suicide attempt: Brother (deceased) Family history of homicide: Patient denies Family history of substance abuse: Patient reports that his brother  mainly did heroin but also did other drugs  Family History: History reviewed. No  pertinent family history.  Social History:   Social History   Socioeconomic History   Marital status: Single    Spouse name: Not on file   Number of children: Not on file   Years of education: Not on file   Highest education level: Not on file  Occupational History   Not on file  Tobacco Use   Smoking status: Every Day    Packs/day: 0.75    Types: Cigarettes   Smokeless tobacco: Never  Substance and Sexual Activity   Alcohol use: Yes   Drug use: Yes    Types: Cocaine   Sexual activity: Yes  Other Topics Concern   Not on file  Social History Narrative   Not on file   Social Determinants of Health   Financial Resource Strain: Not on file  Food Insecurity: Food Insecurity Present (11/03/2022)   Hunger Vital Sign    Worried About Running Out of Food in the Last Year: Sometimes true    Ran Out of Food in the Last Year: Often true  Transportation Needs: No Transportation Needs (11/03/2022)   PRAPARE - Hydrologist (Medical): No    Lack of Transportation (Non-Medical): No  Physical Activity: Not on file  Stress: Not on file  Social Connections: Not on file    Additional Social History:  Patient endorses social support through friends.  Patient denies having children of his own.  Patient is currently living at [place].  Patient is currently employed and currently serves tables.  Patient denies a past history of military experience.  Patient denies a past history of prison or jail time.  Patient has completed some college courses.  Patient denies having access to weapons.  Allergies:  No Known Allergies  Metabolic Disorder Labs: Lab Results  Component Value Date   HGBA1C 5.0 11/02/2022   MPG 96.8 11/02/2022   MPG 116.89 04/25/2022   Lab Results  Component Value Date   PROLACTIN 3.3 (L) 11/02/2022   Lab Results  Component Value Date   CHOL 237 (H)  11/02/2022   TRIG 157 (H) 11/02/2022   HDL >135 11/02/2022   CHOLHDL NOT CALCULATED 11/02/2022   VLDL 31 11/02/2022   LDLCALC NOT CALCULATED 11/02/2022   LDLCALC 109 (H) 04/25/2022   Lab Results  Component Value Date   TSH 1.563 11/02/2022    Therapeutic Level Labs: No results found for: "LITHIUM" No results found for: "CBMZ" No results found for: "VALPROATE"  Current Medications: Current Outpatient Medications  Medication Sig Dispense Refill   ARIPiprazole (ABILIFY) 10 MG tablet Take 1 tablet (10 mg total) by mouth daily. 30 tablet 0   benzocaine (ORAJEL) 10 % mucosal gel Use as directed in the mouth or throat 3 (three) times daily as needed for mouth pain. 5.3 g 0   FLUoxetine (PROZAC) 40 MG capsule Take 1 capsule (40 mg total) by mouth daily. 30 capsule 1   gabapentin (NEURONTIN) 100 MG capsule Take 2 capsules (200 mg total) by mouth 3 (three) times daily. 180 capsule 1   hydrOXYzine (ATARAX) 25 MG tablet Take 1 tablet (25 mg total) by mouth every 6 (six) hours as needed (anxiety). 75 tablet 1   nicotine (NICODERM CQ - DOSED IN MG/24 HOURS) 14 mg/24hr patch Place 1 patch (14 mg total) onto the skin daily. 28 patch 0   prazosin (MINIPRESS) 1 MG capsule Take 1 capsule (1 mg total) by mouth at bedtime. 30 capsule 1   propranolol (INDERAL) 10  MG tablet Take 1 tablet (10 mg total) by mouth 2 (two) times daily. 60 tablet 1   traZODone (DESYREL) 50 MG tablet Take 1 tablet (50 mg total) by mouth at bedtime. 30 tablet 1   No current facility-administered medications for this visit.    Musculoskeletal: Strength & Muscle Tone: within normal limits Gait & Station: normal Patient leans: N/A  Psychiatric Specialty Exam: Review of Systems  Psychiatric/Behavioral:  Positive for agitation and sleep disturbance. Negative for decreased concentration, dysphoric mood, hallucinations, self-injury and suicidal ideas. The patient is nervous/anxious. The patient is not hyperactive.     Blood  pressure 130/85, pulse 70, temperature 97.9 F (36.6 C), temperature source Oral, height '6\' 1"'$  (1.854 m), weight 187 lb 6.4 oz (85 kg), SpO2 99 %.Body mass index is 24.72 kg/m.  General Appearance: Casual  Eye Contact:  Good  Speech:  Clear and Coherent and Normal Rate  Volume:  Normal  Mood:  Anxious, Depressed, and Irritable  Affect:  Congruent  Thought Process:  Coherent and Descriptions of Associations: Intact  Orientation:  Full (Time, Place, and Person)  Thought Content:  WDL  Suicidal Thoughts:  No  Homicidal Thoughts:  No  Memory:  Immediate;   Good Recent;   Good Remote;   Good  Judgement:  Good  Insight:  Good  Psychomotor Activity:  Restlessness  Concentration:  Concentration: Good and Attention Span: Good  Recall:  Good  Fund of Knowledge:Good  Language: Good  Akathisia:  Yes  Handed:  Right  AIMS (if indicated):  not done  Assets:  Communication Skills Desire for Improvement Housing Social Support Transportation Vocational/Educational  ADL's:  Intact  Cognition: WNL  Sleep:  Fair   Screenings: AIMS    Flowsheet Row Admission (Discharged) from 04/26/2022 in [facility] [facility]  AIMS Total Score 0      AUDIT    Flowsheet Row Admission (Discharged) from 11/03/2022 in [place] [facility] Admission (Discharged) from 04/26/2022 in [facility] [facility]  Alcohol Use Disorder Identification Test Final Score (AUDIT) 27 0      GAD-7    Flowsheet Row Office Visit from 12/12/2022 in [facility] Office Visit from 05/09/2022 in [facility]  Total GAD-7 Score 19 16      PHQ2-9    [place] Office Visit from 12/12/2022 in [facility] Office Visit from 05/09/2022 in [place]  PHQ-2 Total Score 6 6  PHQ-9 Total Score 25 21      [place] Office Visit from  12/12/2022 in [facility] Admission (Discharged) from 11/03/2022 in [place] [facility] from 11/02/2022 in [place] No Risk High Risk       Assessment and Plan: ***    Collaboration of Care: Medication Management AEB provider managing patient's psychiatric medications, Psychiatrist AEB patient being followed by a mental health provider, and Referral or follow-up with counselor/therapist AEB patient being up with a licensed clinical social worker at this facility  Patient/Guardian was advised Release of Information must be obtained prior to any record release in order to collaborate their care with an outside provider. Patient/Guardian was advised if they have not already done so to contact the registration department to sign all necessary forms in order for [place] to release information regarding their care.   Consent: Patient/Guardian  gives verbal consent for treatment and assignment of benefits for services provided during this visit. Patient/Guardian expressed understanding and agreed to proceed.   1. PTSD (post-traumatic stress disorder)  - FLUoxetine (PROZAC) 40 MG capsule; Take 1 capsule (40 mg total) by mouth daily.  Dispense: 30 capsule; Refill: 1 - prazosin (MINIPRESS) 1 MG capsule; Take 1 capsule (1 mg total) by mouth at bedtime.  Dispense: 30 capsule; Refill: 1  2. Generalized anxiety disorder  - hydrOXYzine (ATARAX) 25 MG tablet; Take 1 tablet (25 mg total) by mouth every 6 (six) hours as needed (anxiety).  Dispense: 75 tablet; Refill: 1 - gabapentin (NEURONTIN) 100 MG capsule; Take 2 capsules (200 mg total) by mouth 3 (three) times daily.  Dispense: 180 capsule; Refill: 1 - FLUoxetine (PROZAC) 40 MG capsule; Take 1 capsule (40 mg total) by mouth daily.  Dispense: 30 capsule; Refill: 1 - propranolol (INDERAL) 10 MG tablet; Take 1 tablet (10 mg total) by mouth 2 (two)  times daily.  Dispense: 60 tablet; Refill: 1  3. Severe episode of recurrent major depressive disorder, without psychotic features ([place])  - traZODone (DESYREL) 50 MG tablet; Take 1 tablet (50 mg total) by mouth at bedtime.  Dispense: 30 tablet; Refill: 1 - FLUoxetine (PROZAC) 40 MG capsule; Take 1 capsule (40 mg total) by mouth daily.  Dispense: 30 capsule; Refill: 1  4. Alcohol use disorder, severe, in early remission ([place])  - gabapentin (NEURONTIN) 100 MG capsule; Take 2 capsules (200 mg total) by mouth 3 (three) times daily.  Dispense: 180 capsule; Refill: 1  Patient to follow up in 6 weeks Provider spent a total of 50 minutes with the patient/reviewing patient's chart  Malachy Mood, PA 2/27/202412:49 PM

## 2023-01-16 ENCOUNTER — Ambulatory Visit (HOSPITAL_COMMUNITY): Payer: No Payment, Other | Admitting: Clinical

## 2023-01-23 ENCOUNTER — Encounter (HOSPITAL_COMMUNITY): Payer: Self-pay | Admitting: Physician Assistant

## 2023-01-23 ENCOUNTER — Ambulatory Visit (INDEPENDENT_AMBULATORY_CARE_PROVIDER_SITE_OTHER): Payer: No Payment, Other | Admitting: Physician Assistant

## 2023-01-23 VITALS — BP 105/67 | HR 62 | Temp 97.5°F | Ht 73.0 in | Wt 191.8 lb

## 2023-01-23 DIAGNOSIS — F1021 Alcohol dependence, in remission: Secondary | ICD-10-CM | POA: Diagnosis not present

## 2023-01-23 DIAGNOSIS — F4310 Post-traumatic stress disorder, unspecified: Secondary | ICD-10-CM | POA: Diagnosis not present

## 2023-01-23 DIAGNOSIS — F99 Mental disorder, not otherwise specified: Secondary | ICD-10-CM

## 2023-01-23 DIAGNOSIS — F411 Generalized anxiety disorder: Secondary | ICD-10-CM

## 2023-01-23 DIAGNOSIS — F5105 Insomnia due to other mental disorder: Secondary | ICD-10-CM

## 2023-01-23 DIAGNOSIS — F332 Major depressive disorder, recurrent severe without psychotic features: Secondary | ICD-10-CM

## 2023-01-23 MED ORDER — FLUOXETINE HCL 20 MG PO CAPS: 60.0000 mg | ORAL_CAPSULE | Freq: Every day | ORAL | 1 refills | Status: AC

## 2023-01-23 MED ORDER — PROPRANOLOL HCL 10 MG PO TABS: 10.0000 mg | ORAL_TABLET | Freq: Two times a day (BID) | ORAL | 1 refills | Status: AC

## 2023-01-23 MED ORDER — HYDROXYZINE HCL 25 MG PO TABS: 25.0000 mg | ORAL_TABLET | Freq: Four times a day (QID) | ORAL | 1 refills | Status: AC | PRN

## 2023-01-23 MED ORDER — RAMELTEON 8 MG PO TABS: 8.0000 mg | ORAL_TABLET | Freq: Every day | ORAL | 1 refills | Status: AC

## 2023-01-23 MED ORDER — GABAPENTIN 300 MG PO CAPS: 300.0000 mg | ORAL_CAPSULE | Freq: Three times a day (TID) | ORAL | 1 refills | Status: AC

## 2023-01-23 MED ORDER — PRAZOSIN HCL 1 MG PO CAPS: 1.0000 mg | ORAL_CAPSULE | Freq: Every day | ORAL | 1 refills | Status: AC

## 2023-01-23 NOTE — Progress Notes (Signed)
BH MD/PA/NP OP Progress Note  01/23/2023 5:34 PM Christian FairlySamuel Colborn  MRN:  [id number]  Chief Complaint:  Chief Complaint  Patient presents with   Follow-up   Medication Management   HPI:   Christian Andrews is a 42 year old male with a past psychiatric history significant for insomnia, generalized anxiety disorder, alcohol use disorder (in early remission), PTSD, and major depressive disorder who [facility] for follow-up and medication management. Patient is currently being managed on the following psychiatric medications:  Prozac 40 mg daily Trazodone 50 mg at bedtime Prazosin 1 mg at bedtime Gabapentin 200 mg 3 times daily Propranolol 10 mg 2 times daily Hydroxyzine 25 mg every 6 hours as needed  Patient reports that he is doing better since the discontinuation of Abilify and denies being as restless.  He reports that he still experiences wanting to jump out of his skin and states that he often aches due to his body constantly feeling tense.  Patient reports that he continues to experience racing thoughts and reports that he has been down in the dumps lately.  Patient also states that he has been waking up roughly every hour at night due to having bad dreams.  Patient rates his depression as 6 out of 10 with 10 being most severe.  Patient experiences depression every day with fluctuating symptoms during the day.  Patient endorses the following depressive symptoms: anxiousness, irritability, racing thoughts, and low mood.  In addition to his depression, the patient continues to endorse anxiety he rates a 7 out of 10.  Patient attributes his anxiety to changes in his life such as currently living at the [facility], living with roommates, and living a sober life.  Patient reports that his depressive symptoms appear to be attributed to the anniversary of his mother's and brother's passing.  A PHQ-9 screen was performed with the patient scoring a 23.   A GAD-7 screen was also performed with the patient scoring an 18.  Patient is alert and oriented x 4, calm, cooperative, and fully engaged in conversation during the encounter.  Patient endorses antsy mood.  Patient denies suicidal or homicidal ideations.  He further denies auditory or visual hallucinations and does not appear to be responding to internal/external stimuli.  Patient endorses fair sleep and receives on average 6 hours of intermittent sleep.  Patient states that he often has nightmares characterized by being chased as well as dreams about death that are really intense.  Patient endorses fair appetite and eats on average 2 meals per day.  Patient denies alcohol consumption and illicit drug use.  Patient endorses tobacco use and smokes on average 10 cigarettes/day.  Visit Diagnosis:    ICD-10-CM   1. Insomnia due to other mental disorder  F51.05 ramelteon (ROZEREM) 8 MG tablet   F99     2. Generalized anxiety disorder  F41.1 gabapentin (NEURONTIN) 300 MG capsule    FLUoxetine (PROZAC) 20 MG capsule    hydrOXYzine (ATARAX) 25 MG tablet    propranolol (INDERAL) 10 MG tablet    3. Alcohol use disorder, severe, in early remission  F10.21 gabapentin (NEURONTIN) 300 MG capsule    4. PTSD (post-traumatic stress disorder)  F43.10 prazosin (MINIPRESS) 1 MG capsule    FLUoxetine (PROZAC) 20 MG capsule    5. Severe episode of recurrent major depressive disorder, without psychotic features  F33.2 FLUoxetine (PROZAC) 20 MG capsule      Past Psychiatric History:  Patient has a past history  of depression and anxiety.  During his hospitalization, patient states that the providers were deciding on whether or not he was bipolar but no official diagnosis was given at the time.   Patient states that he was previously hospitalized due to mental health in July of last year.  During that time, patient states that he was hospitalized due to major depressive disorder and suicidal thoughts with no plan  (patient states that he was experiencing racing thoughts at the time).   Patient endorses a past history of suicide 20 years ago.  Patient states that he his attempted suicide by overdosing and by cutting his wrist  Past Medical History: History reviewed. No pertinent past medical history.  Past Surgical History:  Procedure Laterality Date   TONSILECTOMY/ADENOIDECTOMY WITH MYRINGOTOMY Bilateral 1997    Family Psychiatric History:  Mother (deceased) - bipolar disorder Brother (deceased) - bipolar disorder   Family history of suicide attempt: Brother (deceased) Family history of homicide: Patient denies Family history of substance abuse: Patient reports that his brother mainly did heroin but also did other drugs  Family History: History reviewed. No pertinent family history.  Social History:  Social History   Socioeconomic History   Marital status: Single    Spouse name: Not on file   Number of children: Not on file   Years of education: Not on file   Highest education level: Not on file  Occupational History   Not on file  Tobacco Use   Smoking status: Every Day    Packs/day: .75    Types: Cigarettes   Smokeless tobacco: Never  Substance and Sexual Activity   Alcohol use: Yes   Drug use: Yes    Types: Cocaine   Sexual activity: Yes  Other Topics Concern   Not on file  Social History Narrative   Not on file   Social Determinants of Health   Financial Resource Strain: Not on file  Food Insecurity: Food Insecurity Present (11/03/2022)   Hunger Vital Sign    Worried About Running Out of Food in the Last Year: Sometimes true    Ran Out of Food in the Last Year: Often true  Transportation Needs: No Transportation Needs (11/03/2022)   PRAPARE - Administrator, Civil Service (Medical): No    Lack of Transportation (Non-Medical): No  Physical Activity: Not on file  Stress: Not on file  Social Connections: Not on file    Allergies: No Known  Allergies  Metabolic Disorder Labs: Lab Results  Component Value Date   HGBA1C 5.0 11/02/2022   MPG 96.8 11/02/2022   MPG 116.89 04/25/2022   Lab Results  Component Value Date   PROLACTIN 3.3 (L) 11/02/2022   Lab Results  Component Value Date   CHOL 237 (H) 11/02/2022   TRIG 157 (H) 11/02/2022   HDL >135 11/02/2022   CHOLHDL NOT CALCULATED 11/02/2022   VLDL 31 11/02/2022   LDLCALC NOT CALCULATED 11/02/2022   LDLCALC 109 (H) 04/25/2022   Lab Results  Component Value Date   TSH 1.563 11/02/2022   TSH 1.527 04/25/2022    Therapeutic Level Labs: No results found for: "LITHIUM" No results found for: "VALPROATE" No results found for: "CBMZ"  Current Medications: Current Outpatient Medications  Medication Sig Dispense Refill   FLUoxetine (PROZAC) 20 MG capsule Take 3 capsules (60 mg total) by mouth daily. 90 capsule 1   ramelteon (ROZEREM) 8 MG tablet Take 1 tablet (8 mg total) by mouth at bedtime. 30 tablet 1  ARIPiprazole (ABILIFY) 10 MG tablet Take 1 tablet (10 mg total) by mouth daily. 30 tablet 0   benzocaine (ORAJEL) 10 % mucosal gel Use as directed in the mouth or throat 3 (three) times daily as needed for mouth pain. 5.3 g 0   gabapentin (NEURONTIN) 300 MG capsule Take 1 capsule (300 mg total) by mouth 3 (three) times daily. 90 capsule 1   hydrOXYzine (ATARAX) 25 MG tablet Take 1 tablet (25 mg total) by mouth every 6 (six) hours as needed (anxiety). 75 tablet 1   nicotine (NICODERM CQ - DOSED IN MG/24 HOURS) 14 mg/24hr patch Place 1 patch (14 mg total) onto the skin daily. 28 patch 0   prazosin (MINIPRESS) 1 MG capsule Take 1 capsule (1 mg total) by mouth at bedtime. 30 capsule 1   propranolol (INDERAL) 10 MG tablet Take 1 tablet (10 mg total) by mouth 2 (two) times daily. 60 tablet 1   traZODone (DESYREL) 50 MG tablet Take 1 tablet (50 mg total) by mouth at bedtime. 30 tablet 1   No current facility-administered medications for this visit.      Musculoskeletal: Strength & Muscle Tone: within normal limits Gait & Station: normal Patient leans: N/A  Psychiatric Specialty Exam: Review of Systems  Psychiatric/Behavioral:  Positive for dysphoric mood and sleep disturbance. Negative for decreased concentration, hallucinations, self-injury and suicidal ideas. The patient is nervous/anxious. The patient is not hyperactive.     Blood pressure 105/67, pulse 62, temperature (!) 97.5 F (36.4 C), temperature source Oral, height 6\' 1"  (1.854 m), weight 191 lb 12.8 oz (87 kg), SpO2 99 %.Body mass index is 25.3 kg/m.  General Appearance: Casual  Eye Contact:  Good  Speech:  Clear and Coherent and Normal Rate  Volume:  Normal  Mood:  Anxious and Depressed  Affect:  Congruent  Thought Process:  Coherent, Goal Directed, and Descriptions of Associations: Intact  Orientation:  Full (Time, Place, and Person)  Thought Content: WDL   Suicidal Thoughts:  No  Homicidal Thoughts:  No  Memory:  Immediate;   Good Recent;   Good Remote;   Good  Judgement:  Good  Insight:  Good  Psychomotor Activity:  Restlessness  Concentration:  Concentration: Good and Attention Span: Good  Recall:  Good  Fund of Knowledge: Good  Language: Good  Akathisia:  No  Handed:  Right  AIMS (if indicated): not done  Assets:  Communication Skills Desire for Improvement Housing Social Support Transportation Vocational/Educational  ADL's:  Intact  Cognition: WNL  Sleep:  Fair   Screenings: AIMS    Flowsheet Row [facility]  AIMS Total Score 0      AUDIT    Flowsheet Row [facility]) [facility] Ad[facility] ([facility]  Alcohol Use Disorder Identification Test Final Score (AUDIT) 27 0      GAD-7    Flowsheet Row Clinical Support from 01/23/2023 in  [facility] Office Visit from 12/12/2022 in [facility] Office Visit from 05/09/2022 in [facility]  Total GAD-7 Score 18 19 16       PHQ2-9    Flowsheet Row Clinical Support from 01/23/2023 in [facility] Office Visit from 12/12/2022 in [facility] Office Visit from 05/09/2022 in [place]  PHQ-2 Total Score 6  6 6  PHQ-9 Total Score 23 25 21       Flowsheet Row Clinical Support from 01/23/2023 in [facility] Office Visit from 12[facility]12/2022 in [facility] Admission (Discharged[facility]  C-SSRS RISK CATEGORY Low Risk Low Risk No Risk        Assessment and Plan:   Christian Andrews is a 42 year old male with a past psychiatric history significant for insomnia, generalized anxiety disorder, alcohol use disorder (in early remission), PTSD, and major depressive disorder who presents to [facility] for follow-up and medication management.  Patient reports that he still continues to feel a little antsy even though he has discontinued his Abilify.  He reports that he has experienced some relief from his restlessness since discontinuing Abilify.  Patient continues to endorse depressive episodes as well as anxiety.  Patient attributes his depressive symptoms to the upcoming anniversary of his mother's and brother's passing.  Patient also attributes his anxiety to changes in his life such as enrolling in [facility] and maintaining sobriety.  Lastly, patient informed provider that he continues to experience nightmares every night.  Provider informed patient that he might be experiencing nightmares due to his trazodone use.  Patient mentioned to provider that he experienced nightmares ever since  taking trazodone.  Provider recommended discontinuing trazodone and starting ramelteon 8 mg at bedtime for the management of his sleep issues.  Provider also recommended increasing patient's Prozac from 40 mg to 60 mg daily for the management of his depressive symptoms and anxiety.  Lastly, provider recommended patient increase his gabapentin from 200 mg 3 times daily to 300 mg 3 times daily for the management of his anxiety.  Patient was agreeable to recommendations.  Patient's medications to be prescribed to pharmacy of choice.  Collaboration of Care: Collaboration of Care: Medication Management AEB provider managing patient's psychiatric medications, Psychiatrist AEB patient being followed by mental health provider, and Referral or follow-up with counselor/therapist AEB patient to be set up with a licensed clinical social worker following the conclusion of the encounter  Patient/Guardian was advised Release of Information must be obtained prior to any record release in order to collaborate their care with an outside provider. Patient/Guardian was advised if they have not already done so to contact the registration department to sign all necessary forms in order for [place] to release information regarding their care.   Consent: Patient/Guardian gives verbal consent for treatment and assignment of benefits for services provided during this visit. Patient/Guardian expressed understanding and agreed to proceed.   1. Generalized anxiety disorder  - gabapentin (NEURONTIN) 300 MG capsule; Take 1 capsule (300 mg total) by mouth 3 (three) times daily.  Dispense: 90 capsule; Refill: 1 - FLUoxetine (PROZAC) 20 MG capsule; Take 3 capsules (60 mg total) by mouth daily.  Dispense: 90 capsule; Refill: 1 - hydrOXYzine (ATARAX) 25 MG tablet; Take 1 tablet (25 mg total) by mouth every 6 (six) hours as needed (anxiety).  Dispense: 75 tablet; Refill: 1 - propranolol (INDERAL) 10 MG tablet; Take 1 tablet (10 mg total) by  mouth 2 (two) times daily.  Dispense: 60 tablet; Refill: 1  2. Alcohol use disorder, severe, in early remission  - gabapentin (NEURONTIN) 300 MG capsule; Take 1 capsule (300 mg total) by mouth 3 (three) times daily.  Dispense: 90 capsule; Refill: 1  3. PTSD (post-traumatic stress disorder)  - prazosin (MINIPRESS) 1 MG capsule; Take 1  capsule (1 mg total) by mouth at bedtime.  Dispense: 30 capsule; Refill: 1 - FLUoxetine (PROZAC) 20 MG capsule; Take 3 capsules (60 mg total) by mouth daily.  Dispense: 90 capsule; Refill: 1  4. Severe episode of recurrent major depressive disorder, without psychotic features  - FLUoxetine (PROZAC) 20 MG capsule; Take 3 capsules (60 mg total) by mouth daily.  Dispense: 90 capsule; Refill: 1  5. Insomnia due to other mental disorder  - ramelteon (ROZEREM) 8 MG tablet; Take 1 tablet (8 mg total) by mouth at bedtime.  Dispense: 30 tablet; Refill: 1  Patient to follow-up in 6 weeks Provider spent a total of 17 minutes with the patient/reviewing patient's chart  Meta Hatchet, PA 01/23/2023, 5:34 PM

## 2023-02-05 ENCOUNTER — Other Ambulatory Visit (HOSPITAL_COMMUNITY): Payer: Self-pay | Admitting: Physician Assistant

## 2023-02-05 DIAGNOSIS — F332 Major depressive disorder, recurrent severe without psychotic features: Secondary | ICD-10-CM

## 2023-02-05 DIAGNOSIS — F4310 Post-traumatic stress disorder, unspecified: Secondary | ICD-10-CM

## 2023-02-05 DIAGNOSIS — F411 Generalized anxiety disorder: Secondary | ICD-10-CM

## 2023-02-09 ENCOUNTER — Other Ambulatory Visit (HOSPITAL_COMMUNITY): Payer: Self-pay | Admitting: Physician Assistant

## 2023-02-09 DIAGNOSIS — F332 Major depressive disorder, recurrent severe without psychotic features: Secondary | ICD-10-CM

## 2023-02-09 DIAGNOSIS — F4310 Post-traumatic stress disorder, unspecified: Secondary | ICD-10-CM

## 2023-02-09 DIAGNOSIS — F411 Generalized anxiety disorder: Secondary | ICD-10-CM

## 2023-02-14 ENCOUNTER — Other Ambulatory Visit (HOSPITAL_COMMUNITY): Payer: Self-pay | Admitting: Physician Assistant

## 2023-02-14 DIAGNOSIS — F332 Major depressive disorder, recurrent severe without psychotic features: Secondary | ICD-10-CM

## 2023-02-27 ENCOUNTER — Encounter (HOSPITAL_COMMUNITY): Payer: No Payment, Other | Admitting: Physician Assistant

## 2023-03-01 ENCOUNTER — Ambulatory Visit (HOSPITAL_COMMUNITY): Payer: No Payment, Other | Admitting: Mental Health

## 2023-05-06 ENCOUNTER — Other Ambulatory Visit (HOSPITAL_COMMUNITY): Payer: Self-pay | Admitting: Physician Assistant

## 2023-05-06 DIAGNOSIS — F411 Generalized anxiety disorder: Secondary | ICD-10-CM

## 2023-05-06 DIAGNOSIS — F4310 Post-traumatic stress disorder, unspecified: Secondary | ICD-10-CM

## 2023-05-06 DIAGNOSIS — F332 Major depressive disorder, recurrent severe without psychotic features: Secondary | ICD-10-CM

## 2023-05-20 ENCOUNTER — Other Ambulatory Visit (HOSPITAL_COMMUNITY): Payer: Self-pay | Admitting: Physician Assistant

## 2023-05-20 DIAGNOSIS — F99 Mental disorder, not otherwise specified: Secondary | ICD-10-CM

## 2023-05-23 ENCOUNTER — Other Ambulatory Visit (HOSPITAL_COMMUNITY): Payer: Self-pay | Admitting: Physician Assistant

## 2023-05-23 DIAGNOSIS — F4310 Post-traumatic stress disorder, unspecified: Secondary | ICD-10-CM

## 2023-05-23 DIAGNOSIS — F411 Generalized anxiety disorder: Secondary | ICD-10-CM

## 2023-05-23 DIAGNOSIS — F332 Major depressive disorder, recurrent severe without psychotic features: Secondary | ICD-10-CM

## 2023-05-23 DIAGNOSIS — F1021 Alcohol dependence, in remission: Secondary | ICD-10-CM
